# Patient Record
Sex: Female | Born: 1990 | Race: White | Hispanic: No | Marital: Married | Smoking: Current every day smoker
Health system: Southern US, Community
[De-identification: ages and names within clinical notes are randomized; demographics above are authoritative.]

## PROBLEM LIST (undated history)

## (undated) DIAGNOSIS — E611 Iron deficiency: Secondary | ICD-10-CM

## (undated) HISTORY — PX: WISDOM TOOTH EXTRACTION: SHX21

---
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
## 2011-01-28 ENCOUNTER — Emergency Department: Payer: Self-pay | Admitting: Unknown Physician Specialty

## 2011-03-26 ENCOUNTER — Ambulatory Visit: Payer: Self-pay | Admitting: Family Medicine

## 2011-06-15 ENCOUNTER — Observation Stay: Payer: Self-pay

## 2011-06-20 ENCOUNTER — Ambulatory Visit: Payer: Self-pay | Admitting: Advanced Practice Midwife

## 2011-06-23 ENCOUNTER — Inpatient Hospital Stay: Payer: Self-pay | Admitting: Obstetrics and Gynecology

## 2012-09-07 ENCOUNTER — Emergency Department: Payer: Self-pay | Admitting: Emergency Medicine

## 2012-09-07 LAB — COMPREHENSIVE METABOLIC PANEL
Anion Gap: 7 (ref 7–16)
BUN: 7 mg/dL (ref 7–18)
Bilirubin,Total: 0.4 mg/dL (ref 0.2–1.0)
Chloride: 106 mmol/L (ref 98–107)
Co2: 25 mmol/L (ref 21–32)
Creatinine: 0.69 mg/dL (ref 0.60–1.30)
EGFR (African American): >60
Osmolality: 273 (ref 275–301)
Potassium: 4.6 mmol/L (ref 3.5–5.1)
SGOT(AST): 37 U/L (ref 15–37)
SGPT (ALT): 12 U/L (ref 12–78)
Total Protein: 8.2 g/dL (ref 6.4–8.2)

## 2012-09-07 LAB — CBC
HCT: 33.2 % — ABNORMAL LOW (ref 35.0–47.0)
HGB: 10.3 g/dL — ABNORMAL LOW (ref 12.0–16.0)
MCH: 27.3 pg (ref 26.0–34.0)
MCHC: 31.2 g/dL — ABNORMAL LOW (ref 32.0–36.0)
Platelet: 348 10*3/uL (ref 150–440)
RBC: 3.78 10*6/uL — ABNORMAL LOW (ref 3.80–5.20)
RDW: 16.9 % — ABNORMAL HIGH (ref 11.5–14.5)
WBC: 12.7 10*3/uL — ABNORMAL HIGH (ref 3.6–11.0)

## 2012-09-07 LAB — URINALYSIS, COMPLETE
Nitrite: NEGATIVE
Protein: NEGATIVE
RBC,UR: 10 /HPF (ref 0–5)
WBC UR: 278 /HPF (ref 0–5)

## 2013-03-10 ENCOUNTER — Emergency Department: Payer: Self-pay | Admitting: Emergency Medicine

## 2013-08-05 ENCOUNTER — Emergency Department: Payer: Self-pay | Admitting: Emergency Medicine

## 2017-07-16 ENCOUNTER — Encounter (HOSPITAL_BASED_OUTPATIENT_CLINIC_OR_DEPARTMENT_OTHER): Payer: Self-pay | Admitting: *Deleted

## 2017-07-16 ENCOUNTER — Emergency Department (HOSPITAL_BASED_OUTPATIENT_CLINIC_OR_DEPARTMENT_OTHER): Payer: Self-pay

## 2017-07-16 ENCOUNTER — Emergency Department (HOSPITAL_BASED_OUTPATIENT_CLINIC_OR_DEPARTMENT_OTHER)
Admission: EM | Admit: 2017-07-16 | Discharge: 2017-07-16 | Disposition: A | Discharge status: Home / Self Care | Payer: Self-pay | Attending: Emergency Medicine | Admitting: Emergency Medicine

## 2017-07-16 DIAGNOSIS — F17200 Nicotine dependence, unspecified, uncomplicated: Secondary | ICD-10-CM | POA: Insufficient documentation

## 2017-07-16 DIAGNOSIS — R42 Dizziness and giddiness: Secondary | ICD-10-CM | POA: Insufficient documentation

## 2017-07-16 DIAGNOSIS — Z79899 Other long term (current) drug therapy: Secondary | ICD-10-CM | POA: Insufficient documentation

## 2017-07-16 HISTORY — DX: Iron deficiency: E61.1

## 2017-07-16 LAB — URINALYSIS, MICROSCOPIC (REFLEX): WBC UA: NONE SEEN WBC/hpf (ref 0–5)

## 2017-07-16 LAB — D-DIMER, QUANTITATIVE: D-Dimer, Quant: <0.27 ug/mL-FEU (ref 0.00–0.50)

## 2017-07-16 LAB — CBC WITH DIFFERENTIAL/PLATELET
BASOS ABS: 0 10*3/uL (ref 0.0–0.1)
Basophils Relative: 0 %
Eosinophils Absolute: 0.1 10*3/uL (ref 0.0–0.7)
Eosinophils Relative: 1 %
HEMATOCRIT: 40.2 % (ref 36.0–46.0)
HEMOGLOBIN: 13.8 g/dL (ref 12.0–15.0)
LYMPHS ABS: 1.8 10*3/uL (ref 0.7–4.0)
Lymphocytes Relative: 28 %
MCH: 33.7 pg (ref 26.0–34.0)
MCHC: 34.3 g/dL (ref 30.0–36.0)
MCV: 98 fL (ref 78.0–100.0)
Monocytes Absolute: 0.5 10*3/uL (ref 0.1–1.0)
Monocytes Relative: 8 %
NEUTROS ABS: 4.2 10*3/uL (ref 1.7–7.7)
NEUTROS PCT: 63 %
PLATELETS: 162 10*3/uL (ref 150–400)
RBC: 4.1 MIL/uL (ref 3.87–5.11)
RDW: 12.1 % (ref 11.5–15.5)
WBC: 6.6 10*3/uL (ref 4.0–10.5)

## 2017-07-16 LAB — COMPREHENSIVE METABOLIC PANEL
ALT: 12 U/L — ABNORMAL LOW (ref 14–54)
ANION GAP: 6 (ref 5–15)
AST: 20 U/L (ref 15–41)
Albumin: 4.1 g/dL (ref 3.5–5.0)
Alkaline Phosphatase: 47 U/L (ref 38–126)
BILIRUBIN TOTAL: 0.8 mg/dL (ref 0.3–1.2)
BUN: 8 mg/dL (ref 6–20)
CHLORIDE: 108 mmol/L (ref 101–111)
CO2: 25 mmol/L (ref 22–32)
Calcium: 9.2 mg/dL (ref 8.9–10.3)
Creatinine, Ser: 0.76 mg/dL (ref 0.44–1.00)
GFR calc Af Amer: >60 mL/min (ref >60)
Glucose, Bld: 86 mg/dL (ref 65–99)
POTASSIUM: 3.8 mmol/L (ref 3.5–5.1)
Sodium: 139 mmol/L (ref 135–145)
TOTAL PROTEIN: 6.9 g/dL (ref 6.5–8.1)

## 2017-07-16 LAB — URINALYSIS, ROUTINE W REFLEX MICROSCOPIC
Bilirubin Urine: NEGATIVE
Glucose, UA: NEGATIVE mg/dL
KETONES UR: NEGATIVE mg/dL
LEUKOCYTES UA: NEGATIVE
Nitrite: NEGATIVE
PROTEIN: NEGATIVE mg/dL
Specific Gravity, Urine: <1.005 — ABNORMAL LOW (ref 1.005–1.030)
pH: 6 (ref 5.0–8.0)

## 2017-07-16 LAB — ETHANOL

## 2017-07-16 LAB — TROPONIN I: Troponin I: <0.03 ng/mL (ref <0.03)

## 2017-07-16 LAB — TSH: TSH: 1.537 u[IU]/mL (ref 0.350–4.500)

## 2017-07-16 LAB — PREGNANCY, URINE: Preg Test, Ur: NEGATIVE

## 2017-07-16 MED ORDER — MECLIZINE HCL 25 MG PO TABS
12.5000 mg | ORAL_TABLET | Freq: Once | ORAL | Status: DC
Start: 2017-07-16 — End: 2017-07-16
  Filled 2017-07-16: qty 1

## 2017-07-16 MED ORDER — MECLIZINE HCL 25 MG PO TABS
12.5000 mg | ORAL_TABLET | Freq: Once | ORAL | Status: AC
  Administered 2017-07-16: 12.5 mg via ORAL
  Filled 2017-07-16: qty 1

## 2017-07-16 MED ORDER — MECLIZINE HCL 25 MG PO TABS: ORAL_TABLET | ORAL | 0 refills | Status: DC

## 2017-07-16 MED ORDER — SODIUM CHLORIDE 0.9 % IV BOLUS (SEPSIS)
1000.0000 mL | Freq: Once | INTRAVENOUS | Status: AC
  Administered 2017-07-16: 1000 mL via INTRAVENOUS

## 2017-07-16 NOTE — ED Notes (Addendum)
Approx 1 min after IV placement, pt became unresponsive. Initially unresponsive to sternal rub. Episode resolved within approx 20 secs. Pt woke up with mild confusion and was tearful. Pt fully back to baseline within 5 mins. Pause noted to cardiac rhythm during episode; pt reports hx of passing out with lab draws -- PA-C aware.

## 2017-07-16 NOTE — Discharge Instructions (Addendum)
There were no acute or significant abnormalities on lab work or imaging. Be sure to stay well-hydrated. Get plenty of rest. Reduce stress. May use the meclizine, as needed, for symptoms of dizziness. You may also try performing the Epley maneuver. Follow up with a primary care provider on this matter. Return to the ED should symptoms worsen.

## 2017-07-16 NOTE — ED Notes (Signed)
Patient transported to CT on bedside cardiac monitor -- accompanied by this RN.

## 2017-07-16 NOTE — ED Notes (Signed)
ED Provider at bedside. 

## 2017-07-16 NOTE — ED Triage Notes (Signed)
Pt reports feeling light-headed and nauseated since last night. Denies fever, diarrhea, genitourinary symptoms.

## 2017-07-16 NOTE — ED Provider Notes (Signed)
[facility]   CSN: [id number] Arrival date & time: 07/16/17  5621     History   Chief Complaint Chief Complaint  Patient presents with  . Dizziness    HPI Melanie [name] is a 26 y.o. female.  HPI   Melanie [name] is a 26 y.o. female, with a history of iron deficiency, presenting to the ED with lightheadedness beginning last night. Occurred while eating, worsened with standing. Accompanied by nausea. States she thought maybe she was dehydrated so she "drank a lot of water." Endorses some urinary urgency beginning yesterday. Currently has some minor lightheadedness/dizziness. States the sensation is worse when turning her head to the left or looking upward. Denies alcohol or illicit drug use. No medication changes. LMP 06/12/17. States she has been eating and drinking regularly. States she has regular weight fluctuations of greater than 10lbs at a time, but does not know why.  Denies fever/chills, vomiting/diarrhea, visual deficits, SOB, CP, abdominal pain, numbness/weakness, head injury, headache, falls, or any other complaints.     Past Medical History:  Diagnosis Date  . Iron deficiency     There are no active problems to display for this patient.   Past Surgical History:  Procedure Laterality Date  . WISDOM TOOTH EXTRACTION      OB History    No data available       Home Medications    Prior to Admission medications   Medication Sig Start Date End Date Taking? Authorizing Provider  Iron Combinations (IRON COMPLEX PO) Take by mouth.   Yes [provider]  Multiple Vitamin (MULTIVITAMIN) tablet Take 1 tablet by mouth daily.   Yes [provider]  meclizine (ANTIVERT) 25 MG tablet Take 0.5 tablet (12.5 mg) to 1 tablet (25 mg) up to 3 times a day as needed for dizziness. 07/16/17   Anselm Pancoast, PA-C    Family History No family history on file.  Social History Social History  Substance Use Topics  . Smoking status:  Current Every Day Smoker  . Smokeless tobacco: Never Used  . Alcohol use No     Allergies   Patient has no known allergies.   Review of Systems Review of Systems  Constitutional: Negative for chills, diaphoresis and fever.  HENT: Negative for trouble swallowing.   Eyes: Negative for visual disturbance.  Respiratory: Negative for shortness of breath.   Cardiovascular: Negative for chest pain.  Gastrointestinal: Positive for nausea. Negative for abdominal pain, blood in stool, diarrhea and vomiting.  Genitourinary: Positive for urgency.  Musculoskeletal: Negative for back pain and neck pain.  Neurological: Positive for dizziness and light-headedness. Negative for seizures, syncope, facial asymmetry, speech difficulty, weakness, numbness and headaches.  Psychiatric/Behavioral: Negative for confusion.  All other systems reviewed and are negative.    Physical Exam Updated Vital Signs BP 112/66 (BP Location: Right Arm)   Pulse 70   Temp 98.2 F (36.8 C) (Oral)   Resp 18   Ht 5\' 4"  (1.626 m)   Wt 39.5 kg (87 lb)   LMP 06/11/2017 (Approximate)   SpO2 100%   BMI 14.93 kg/m   Physical Exam  Constitutional: She is oriented to person, place, and time. She appears well-developed and well-nourished. No distress.  HENT:  Head: Normocephalic and atraumatic.  Eyes: Pupils are equal, round, and reactive to light. Conjunctivae and EOM are normal.  Neck: Normal range of motion. Neck supple.  Cardiovascular: Normal rate, regular rhythm, normal heart sounds and intact distal  pulses.   Pulmonary/Chest: Effort normal and breath sounds normal. No respiratory distress.  Abdominal: Soft. There is no tenderness. There is no guarding.  Musculoskeletal: She exhibits no edema.  Normal motor function intact in all extremities and spine. No midline spinal tenderness.   Lymphadenopathy:    She has no cervical adenopathy.  Neurological: She is alert and oriented to person, place, and time.  No  sensory deficits. Strength 5/5 in all extremities. No gait disturbance. Coordination intact including heel to shin and finger to nose. Cranial nerves III-XII grossly intact. No facial droop.  Dizziness worse with lying down and turning head to left. No vertical nystagmus. Cover-uncover test normal.   Skin: Skin is warm and dry. Capillary refill takes less than 2 seconds. She is not diaphoretic.  Psychiatric: She has a normal mood and affect. Her behavior is normal.  Nursing note and vitals reviewed.    ED Treatments / Results  Labs (all labs ordered are listed, but only abnormal results are displayed) Labs Reviewed  URINALYSIS, ROUTINE W REFLEX MICROSCOPIC - Abnormal; Notable for the following:       Result Value   Specific Gravity, Urine <1.005 (*)    Hgb urine dipstick TRACE (*)    All other components within normal limits  COMPREHENSIVE METABOLIC PANEL - Abnormal; Notable for the following:    ALT 12 (*)    All other components within normal limits  URINALYSIS, MICROSCOPIC (REFLEX) - Abnormal; Notable for the following:    Bacteria, UA RARE (*)    Squamous Epithelial / LPF 0-5 (*)    All other components within normal limits  PREGNANCY, URINE  ETHANOL  CBC WITH DIFFERENTIAL/PLATELET  TROPONIN I  TSH  D-DIMER, QUANTITATIVE (NOT AT [facility])    EKG  EKG Interpretation  Date/Time:  Wednesday July 16 2017 11:07:05 EDT Ventricular Rate:  72 PR Interval:    QRS Duration: 83 QT Interval:  390 QTC Calculation: 427 R Axis:   74 Text Interpretation:  Sinus arrhythmia No previous ECGs available Confirmed by Frederick Peers [phone]) on 07/16/2017 11:45:25 AM       Radiology Dg Chest 2 View  Result Date: 07/16/2017 CLINICAL DATA:  Lightheadedness.  Nausea. EXAM: CHEST  2 VIEW COMPARISON:  No recent prior . FINDINGS: Mediastinum hilar structures normal. Lungs are clear. Nipple shadow noted . No focal infiltrate. No pleural effusion or pneumothorax. No acute bony abnormality  identified. Thoracic spine scoliosis . IMPRESSION: No acute cardiopulmonary disease. Electronically Signed   By: Maisie Fus  Register   On: 07/16/2017 12:27   Ct Head Wo Contrast  Result Date: 07/16/2017 CLINICAL DATA:  Pt reports feeling light-headed, nauseated, and just not herself since last night, states that she passed out while nurse was starting her IV, patient appears very shaky, hx of anemia, no other complaints EXAM: CT HEAD WITHOUT CONTRAST TECHNIQUE: Contiguous axial images were obtained from the base of the skull through the vertex without intravenous contrast. COMPARISON:  None. FINDINGS: Brain: No evidence of acute infarction, hemorrhage, hydrocephalus, extra-axial collection or mass lesion/mass effect. Vascular: No hyperdense vessel or unexpected calcification. Skull: Normal. Negative for fracture or focal lesion. Sinuses/Orbits: Visualize globes orbits are unremarkable. Visualized sinuses and mastoid air cells are clear. Other: None. IMPRESSION: Normal unenhanced CT scan of the brain. Electronically Signed   By: Amie Portland M.D.   On: 07/16/2017 12:27    Procedures Procedures (including critical care time)  Medications Ordered in ED Medications  sodium chloride 0.9 % bolus 1,000 mL (  0 mLs Intravenous Stopped 07/16/17 1200)  meclizine (ANTIVERT) tablet 12.5 mg (12.5 mg Oral Given 07/16/17 1335)     Initial Impression / Assessment and Plan / ED Course  I have reviewed the triage vital signs and the nursing notes.  Pertinent labs & imaging results that were available during my care of the patient were reviewed by me and considered in my medical decision making (see chart for details).     Patient presents with complaint of dizziness. Patient is nontoxic appearing, afebrile, not tachycardic, not tachypneic, not hypotensive, maintains SPO2 of 100% on room air, and is in no apparent distress.  Symptoms appear to be consistent with peripheral vertigo. Dizziness reproducible. Lab  results reassuring. Patient improved with meclizine. Ambulatory around the department without difficulty or assistance. PCP follow-up. Resources given. The patient was given instructions for home care as well as return precautions. Patient voices understanding of these instructions, accepts the plan, and is comfortable with discharge.   RN reports patient had a complete syncopal episode while lying supine right after IV start. RN reports complete unresponsiveness for a period of about 20 seconds. Suspect vasovagal reaction. EKG findings: I think arrhythmia is unlikely. EKG shows minor sinus arrhythmia with no interval abnormalities such as QT prolongation or WPW. There are no findings to suggest Brugada syndrome. Cardiac monitoring in the emergency department reveals not tachycardic or bradycardic dysrhythmia. Hypertrophic cardiomyopathy was considered, but there are no clear historical elements pointing toward this. EKG is not suggestive. The QRS voltages is not extremely large and there are no suggestive Q waves.  Patient does not have any of the following high risk syncope factors: Older age Palpitations prior to syncope Exertional syncope Family history of sudden cardiac death Evidence of bleeding Persistently abnormal vital signs Heart murmur Abnormal EKG   Vitals:   07/16/17 1219 07/16/17 1230 07/16/17 1300 07/16/17 1336  BP: 109/68 107/65 104/62 113/80  Pulse: 85 87 95 77  Resp: 15 14 15 16   Temp:      TempSrc:      SpO2: 100% 100% 100% 100%  Weight:      Height:         Orthostatic VS for the past 24 hrs:  BP- Lying Pulse- Lying BP- Sitting Pulse- Sitting BP- Standing at 0 minutes Pulse- Standing at 0 minutes  07/16/17 1111 104/61 66 110/67 69 115/68 89      Final Clinical Impressions(s) / ED Diagnoses   Final diagnoses:  Dizziness    New Prescriptions Discharge Medication List as of 07/16/2017  1:45 PM    START taking these medications   Details  meclizine  (ANTIVERT) 25 MG tablet Take 0.5 tablet (12.5 mg) to 1 tablet (25 mg) up to 3 times a day as needed for dizziness., Print         Anselm PancoastJoy, Sheritha Louis C, PA-C 07/16/17 1754    Little, Ambrose Finlandachel Morgan, MD 07/17/17 [phone]

## 2017-11-07 ENCOUNTER — Other Ambulatory Visit: Payer: Self-pay

## 2017-11-07 ENCOUNTER — Encounter (HOSPITAL_BASED_OUTPATIENT_CLINIC_OR_DEPARTMENT_OTHER): Payer: Self-pay | Admitting: Emergency Medicine

## 2017-11-07 ENCOUNTER — Emergency Department (HOSPITAL_BASED_OUTPATIENT_CLINIC_OR_DEPARTMENT_OTHER)
Admission: EM | Admit: 2017-11-07 | Discharge: 2017-11-07 | Disposition: A | Discharge status: Home / Self Care | Payer: Self-pay | Attending: Emergency Medicine | Admitting: Emergency Medicine

## 2017-11-07 DIAGNOSIS — J029 Acute pharyngitis, unspecified: Secondary | ICD-10-CM | POA: Insufficient documentation

## 2017-11-07 DIAGNOSIS — Z79899 Other long term (current) drug therapy: Secondary | ICD-10-CM | POA: Insufficient documentation

## 2017-11-07 DIAGNOSIS — F17210 Nicotine dependence, cigarettes, uncomplicated: Secondary | ICD-10-CM | POA: Insufficient documentation

## 2017-11-07 LAB — RAPID STREP SCREEN (MED CTR MEBANE ONLY): Streptococcus, Group A Screen (Direct): NEGATIVE

## 2017-11-07 NOTE — ED Triage Notes (Signed)
Patient reports that she has "white spots" on the back of her throat - the patient states that she has had a sore throat x 2 weeks

## 2017-11-07 NOTE — ED Provider Notes (Signed)
[facility] EMERGENCY DEPARTMENT Provider Note   CSN: [id number] Arrival date & time: 11/07/17  1346     History   Chief Complaint Chief Complaint  Patient presents with  . Sore Throat    HPI Melanie [name] is a 26 y.o. female.  The history is provided by the patient. No language interpreter was used.  Sore Throat    Melanie [name] is a 26 y.o. female who presents to the Emergency Department complaining of sore throat.  She reports 1-2 weeks of sore throat that is mild in nature.  She does have associated runny nose, occasional sneezing and occasional cough.  She thought she had a regular cold.  She looked in her throat and noticed there were white spots in the back and became concerned and came in for evaluation.  No reports of fever, vomiting, diarrhea.  She has been using salt water mixed with baking soda and apple cider vinegar gargles with partial improvement in her symptoms.  She has no medical problems and takes no medications. Past Medical History:  Diagnosis Date  . Iron deficiency     There are no active problems to display for this patient.   Past Surgical History:  Procedure Laterality Date  . WISDOM TOOTH EXTRACTION      OB History    No data available       Home Medications    Prior to Admission medications   Medication Sig Start Date End Date Taking? Authorizing Provider  Iron Combinations (IRON COMPLEX PO) Take by mouth.    [provider]  meclizine (ANTIVERT) 25 MG tablet Take 0.5 tablet (12.5 mg) to 1 tablet (25 mg) up to 3 times a day as needed for dizziness. 07/16/17   Joy, Shawn C, PA-C  Multiple Vitamin (MULTIVITAMIN) tablet Take 1 tablet by mouth daily.    [provider]    Family History History reviewed. No pertinent family history.  Social History Social History   Tobacco Use  . Smoking status: Current Every Day Smoker  . Smokeless tobacco: Never Used  Substance Use Topics  . Alcohol use: No  .  Drug use: No     Allergies   Patient has no known allergies.   Review of Systems Review of Systems  All other systems reviewed and are negative.    Physical Exam Updated Vital Signs BP 115/76 (BP Location: Left Arm)   Pulse 74   Temp (!) 97.5 F (36.4 C) (Oral)   Resp 18   Ht 5\' 4"  (1.626 m)   Wt 39.9 kg (88 lb)   LMP 10/17/2017 (Approximate)   SpO2 100%   BMI 15.11 kg/m   Physical Exam  Constitutional: She is oriented to person, place, and time. She appears well-developed and well-nourished.  HENT:  Head: Normocephalic and atraumatic.  Right Ear: Tympanic membrane normal.  Left Ear: Tympanic membrane normal.  Mouth/Throat: Uvula is midline and oropharynx is clear and moist.  Minimal erythema in the posterior oropharynx.  There is one small exudate on the left tonsil.  No cervical lymphadenopathy.  No peritonsillar abscess.  Cardiovascular: Normal rate and regular rhythm.  No murmur heard. Pulmonary/Chest: Effort normal and breath sounds normal. No respiratory distress.  Abdominal: Soft. There is no tenderness. There is no rebound and no guarding.  Musculoskeletal: She exhibits no edema or tenderness.  Neurological: She is alert and oriented to person, place, and time.  Skin: Skin is warm and dry.  Psychiatric: She has a  normal mood and affect. Her behavior is normal.  Nursing note and vitals reviewed.    ED Treatments / Results  Labs (all labs ordered are listed, but only abnormal results are displayed) Labs Reviewed  RAPID STREP SCREEN (NOT AT [facility])  CULTURE, GROUP A STREP [facility]    EKG  EKG Interpretation None       Radiology No results found.  Procedures Procedures (including critical care time)  Medications Ordered in ED Medications - No data to display   Initial Impression / Assessment and Plan / ED Course  I have reviewed the triage vital signs and the nursing notes.  Pertinent labs & imaging results that were available during my care  of the patient were reviewed by me and considered in my medical decision making (see chart for details).     Patient here for evaluation of sore throat and pharyngeal exudates.  She is nontoxic appearing on examination with small amount of exudate.  No evidence of PTA, RPA.  Counseled patient on home care for viral pharyngitis.  Discussed outpatient follow-up and return precautions.  Final Clinical Impressions(s) / ED Diagnoses   Final diagnoses:  Viral pharyngitis    ED Discharge Orders    None       Tilden Fossaees, Laretha Luepke, MD 11/07/17 1507

## 2017-11-10 LAB — CULTURE, GROUP A STREP (THRC)

## 2018-02-18 ENCOUNTER — Other Ambulatory Visit: Payer: Self-pay

## 2018-02-18 ENCOUNTER — Emergency Department
Admission: EM | Admit: 2018-02-18 | Discharge: 2018-02-18 | Disposition: A | Discharge status: Home / Self Care | Payer: Self-pay | Attending: Emergency Medicine | Admitting: Emergency Medicine

## 2018-02-18 ENCOUNTER — Encounter: Payer: Self-pay | Admitting: Emergency Medicine

## 2018-02-18 DIAGNOSIS — F410 Panic disorder [episodic paroxysmal anxiety] without agoraphobia: Secondary | ICD-10-CM | POA: Insufficient documentation

## 2018-02-18 DIAGNOSIS — Z79899 Other long term (current) drug therapy: Secondary | ICD-10-CM | POA: Insufficient documentation

## 2018-02-18 DIAGNOSIS — F17200 Nicotine dependence, unspecified, uncomplicated: Secondary | ICD-10-CM | POA: Insufficient documentation

## 2018-02-18 LAB — URINALYSIS, COMPLETE (UACMP) WITH MICROSCOPIC
Bilirubin Urine: NEGATIVE
Glucose, UA: NEGATIVE mg/dL
Ketones, ur: NEGATIVE mg/dL
Leukocytes, UA: NEGATIVE
Nitrite: NEGATIVE
Protein, ur: NEGATIVE mg/dL
RBC / HPF: NONE SEEN RBC/hpf (ref 0–5)
SPECIFIC GRAVITY, URINE: 1.005 (ref 1.005–1.030)
pH: 7 (ref 5.0–8.0)

## 2018-02-18 LAB — TSH: TSH: 1.439 u[IU]/mL (ref 0.350–4.500)

## 2018-02-18 LAB — COMPREHENSIVE METABOLIC PANEL
ALBUMIN: 3.7 g/dL (ref 3.5–5.0)
ALT: 22 U/L (ref 14–54)
ANION GAP: 5 (ref 5–15)
AST: 28 U/L (ref 15–41)
Alkaline Phosphatase: 58 U/L (ref 38–126)
CALCIUM: 8.5 mg/dL — AB (ref 8.9–10.3)
CO2: 26 mmol/L (ref 22–32)
CREATININE: 0.59 mg/dL (ref 0.44–1.00)
Chloride: 111 mmol/L (ref 101–111)
GFR calc Af Amer: >60 mL/min (ref >60)
GFR calc non Af Amer: >60 mL/min (ref >60)
GLUCOSE: 105 mg/dL — AB (ref 65–99)
Potassium: 3.7 mmol/L (ref 3.5–5.1)
SODIUM: 142 mmol/L (ref 135–145)
Total Bilirubin: 0.7 mg/dL (ref 0.3–1.2)
Total Protein: 6.7 g/dL (ref 6.5–8.1)

## 2018-02-18 LAB — URINE DRUG SCREEN, QUALITATIVE (ARMC ONLY)
Amphetamines, Ur Screen: NOT DETECTED
BARBITURATES, UR SCREEN: NOT DETECTED
BENZODIAZEPINE, UR SCRN: NOT DETECTED
CANNABINOID 50 NG, UR ~~LOC~~: NOT DETECTED
COCAINE METABOLITE, UR ~~LOC~~: NOT DETECTED
MDMA (Ecstasy)Ur Screen: NOT DETECTED
Methadone Scn, Ur: NOT DETECTED
OPIATE, UR SCREEN: NOT DETECTED
Phencyclidine (PCP) Ur S: NOT DETECTED
TRICYCLIC, UR SCREEN: NOT DETECTED

## 2018-02-18 LAB — CBC WITH DIFFERENTIAL/PLATELET
Basophils Absolute: 0.1 10*3/uL (ref 0–0.1)
Basophils Relative: 1 %
Eosinophils Absolute: 0 10*3/uL (ref 0–0.7)
Eosinophils Relative: 0 %
HCT: 42.2 % (ref 35.0–47.0)
Hemoglobin: 13.6 g/dL (ref 12.0–16.0)
Lymphocytes Relative: 12 %
Lymphs Abs: 1.1 10*3/uL (ref 1.0–3.6)
MCH: 33.1 pg (ref 26.0–34.0)
MCHC: 32.3 g/dL (ref 32.0–36.0)
MCV: 102.6 fL — ABNORMAL HIGH (ref 80.0–100.0)
MONO ABS: 0.5 10*3/uL (ref 0.2–0.9)
MONOS PCT: 5 %
NEUTROS ABS: 8.1 10*3/uL — AB (ref 1.4–6.5)
Neutrophils Relative %: 82 %
Platelets: 171 10*3/uL (ref 150–440)
RBC: 4.11 MIL/uL (ref 3.80–5.20)
RDW: 13.6 % (ref 11.5–14.5)
WBC: 9.8 10*3/uL (ref 3.6–11.0)

## 2018-02-18 LAB — T4, FREE: FREE T4: 1 ng/dL (ref 0.61–1.12)

## 2018-02-18 LAB — HCG, QUANTITATIVE, PREGNANCY: hCG, Beta Chain, Quant, S: <1 m[IU]/mL (ref <5)

## 2018-02-18 MED ORDER — LORAZEPAM 1 MG PO TABS
1.0000 mg | ORAL_TABLET | Freq: Once | ORAL | Status: AC
  Administered 2018-02-18: 1 mg via ORAL
  Filled 2018-02-18: qty 1

## 2018-02-18 MED ORDER — SODIUM CHLORIDE 0.9 % IV BOLUS
1000.0000 mL | Freq: Once | INTRAVENOUS | Status: AC
  Administered 2018-02-18: 1000 mL via INTRAVENOUS

## 2018-02-18 MED ORDER — LORAZEPAM 1 MG PO TABS: 1.0000 mg | ORAL_TABLET | Freq: Two times a day (BID) | ORAL | 0 refills | Status: AC | PRN

## 2018-02-18 NOTE — ED Triage Notes (Addendum)
Arrives via [facility].  Patient states she felt like she was going to pass out, feeling shaky.  Patient has history of anxiety and states "I think that is what it is. I don't know".  Onset of symptoms 0900.  Patient AAOx3. Skin warm and dry. NAD  Emotionally labile.  Patient suddenly became tearful in triage stating "I just don't know what is wrong with me"

## 2018-02-18 NOTE — ED Notes (Signed)
Pt is extremely anxious at this time about doing blood work, pt hyperventilating and sobbing, HR 150s. RN held non rebreather to pt and did breathing exercises, pt became calm, HR 110s. MD made aware

## 2018-02-18 NOTE — ED Notes (Addendum)
Will wait for PO ativan to take affect before drawing lab work per MD , pt aware of plan of care and agrees.

## 2018-02-18 NOTE — Discharge Instructions (Signed)
Please make an appointment to follow-up with a therapist for reevaluation and use your Ativan only as needed for severe symptoms.  Return to the emergency department for any concerns.  It was a pleasure to take care of you today, and thank you for coming to our emergency department.  If you have any questions or concerns before leaving please ask the nurse to grab me and I'm more than happy to go through your aftercare instructions again.  If you were prescribed any opioid pain medication today such as Norco, Vicodin, Percocet, morphine, hydrocodone, or oxycodone please make sure you do not drive when you are taking this medication as it can alter your ability to drive safely.  If you have any concerns once you are home that you are not improving or are in fact getting worse before you can make it to your follow-up appointment, please do not hesitate to call [phone] and come back for further evaluation.  Merrily Brittle, MD  Results for orders placed or performed during the hospital encounter of 02/18/18  Comprehensive metabolic panel  Result Value Ref Range   Sodium 142 135 - 145 mmol/L   Potassium 3.7 3.5 - 5.1 mmol/L   Chloride 111 101 - 111 mmol/L   CO2 26 22 - 32 mmol/L   Glucose, Bld 105 (H) 65 - 99 mg/dL   BUN <5 (L) 6 - 20 mg/dL   Creatinine, Ser 1.61 0.44 - 1.00 mg/dL   Calcium 8.5 (L) 8.9 - 10.3 mg/dL   Total Protein 6.7 6.5 - 8.1 g/dL   Albumin 3.7 3.5 - 5.0 g/dL   AST 28 15 - 41 U/L   ALT 22 14 - 54 U/L   Alkaline Phosphatase 58 38 - 126 U/L   Total Bilirubin 0.7 0.3 - 1.2 mg/dL   GFR calc non Af Amer >60 >60 mL/min   GFR calc Af Amer >60 >60 mL/min   Anion gap 5 5 - 15  CBC with Differential  Result Value Ref Range   WBC 9.8 3.6 - 11.0 K/uL   RBC 4.11 3.80 - 5.20 MIL/uL   Hemoglobin 13.6 12.0 - 16.0 g/dL   HCT 09.6 04.5 - 40.9 %   MCV 102.6 (H) 80.0 - 100.0 fL   MCH 33.1 26.0 - 34.0 pg   MCHC 32.3 32.0 - 36.0 g/dL   RDW 81.1 91.4 - 78.2 %   Platelets 171 150 - 440 K/uL    Neutrophils Relative % 82 %   Neutro Abs 8.1 (H) 1.4 - 6.5 K/uL   Lymphocytes Relative 12 %   Lymphs Abs 1.1 1.0 - 3.6 K/uL   Monocytes Relative 5 %   Monocytes Absolute 0.5 0.2 - 0.9 K/uL   Eosinophils Relative 0 %   Eosinophils Absolute 0.0 0 - 0.7 K/uL   Basophils Relative 1 %   Basophils Absolute 0.1 0 - 0.1 K/uL  TSH  Result Value Ref Range   TSH 1.439 0.350 - 4.500 uIU/mL  T4, free  Result Value Ref Range   Free T4 1.00 0.61 - 1.12 ng/dL  hCG, quantitative, pregnancy  Result Value Ref Range   hCG, Beta Chain, Quant, S <1 <5 mIU/mL  Urine Drug Screen, Qualitative  Result Value Ref Range   Tricyclic, Ur Screen NONE DETECTED NONE DETECTED   Amphetamines, Ur Screen NONE DETECTED NONE DETECTED   MDMA (Ecstasy)Ur Screen NONE DETECTED NONE DETECTED   Cocaine Metabolite,Ur [place] NONE DETECTED NONE DETECTED   Opiate, Ur Screen NONE DETECTED  NONE DETECTED   Phencyclidine (PCP) Ur S NONE DETECTED NONE DETECTED   Cannabinoid 50 Ng, Ur [place] NONE DETECTED NONE DETECTED   Barbiturates, Ur Screen NONE DETECTED NONE DETECTED   Benzodiazepine, Ur Scrn NONE DETECTED NONE DETECTED   Methadone Scn, Ur NONE DETECTED NONE DETECTED  Urinalysis, Complete w Microscopic  Result Value Ref Range   Color, Urine YELLOW (A) YELLOW   APPearance HAZY (A) CLEAR   Specific Gravity, Urine 1.005 1.005 - 1.030   pH 7.0 5.0 - 8.0   Glucose, UA NEGATIVE NEGATIVE mg/dL   Hgb urine dipstick LARGE (A) NEGATIVE   Bilirubin Urine NEGATIVE NEGATIVE   Ketones, ur NEGATIVE NEGATIVE mg/dL   Protein, ur NEGATIVE NEGATIVE mg/dL   Nitrite NEGATIVE NEGATIVE   Leukocytes, UA NEGATIVE NEGATIVE   RBC / HPF NONE SEEN 0 - 5 RBC/hpf   WBC, UA 0-5 0 - 5 WBC/hpf   Bacteria, UA FEW (A) NONE SEEN   Squamous Epithelial / LPF 0-5 (A) NONE SEEN   Mucus PRESENT

## 2018-02-18 NOTE — ED Provider Notes (Signed)
[facility] Emergency Department Provider Note  ____________________________________________   First MD Initiated Contact with Patient 02/18/18 1049     (approximate)  I have reviewed the triage vital signs and the nursing notes.   HISTORY  Chief Complaint Anxiety   HPI Melanie [name] is a 27 y.o. female who comes to the emergency department via EMS with a near syncopal event.  She has a long-standing history of anxiety and today she began to hyperventilate feel short of breath and feel a "sense of doom" associated with lightheadedness dizziness and nearly passing out.  She has no family history of sudden cardiac death.  She has not actually passed out.  Her symptoms are intermittent and severe.  They seem to be worsened when thinking about life stressors and improved with deep breathing.  Past Medical History:  Diagnosis Date  . Iron deficiency     There are no active problems to display for this patient.   Past Surgical History:  Procedure Laterality Date  . WISDOM TOOTH EXTRACTION      Prior to Admission medications   Medication Sig Start Date End Date Taking? Authorizing Provider  Iron Combinations (IRON COMPLEX PO) Take by mouth.    [provider]  LORazepam (ATIVAN) 1 MG tablet Take 1 tablet (1 mg total) by mouth 2 (two) times daily as needed for anxiety. 02/18/18 02/18/19  Merrily Brittleifenbark, Mauriah Mcmillen, MD  meclizine (ANTIVERT) 25 MG tablet Take 0.5 tablet (12.5 mg) to 1 tablet (25 mg) up to 3 times a day as needed for dizziness. 07/16/17   Joy, Shawn C, PA-C  Multiple Vitamin (MULTIVITAMIN) tablet Take 1 tablet by mouth daily.    [provider]    Allergies Patient has no known allergies.  No family history on file.  Social History Social History   Tobacco Use  . Smoking status: Current Every Day Smoker  . Smokeless tobacco: Never Used  Substance Use Topics  . Alcohol use: No  . Drug use: No    Review of  Systems Constitutional: No fever/chills Eyes: No visual changes. ENT: No sore throat. Cardiovascular: Positive for chest pain. Respiratory: Positive for shortness of breath. Gastrointestinal: No abdominal pain.  No nausea, no vomiting.  No diarrhea.  No constipation. Genitourinary: Negative for dysuria. Musculoskeletal: Negative for back pain. Skin: Negative for rash. Neurological: Negative for headaches, focal weakness or numbness.   ____________________________________________   PHYSICAL EXAM:  VITAL SIGNS: ED Triage Vitals  Enc Vitals Group     BP 02/18/18 1039 130/74     Pulse Rate 02/18/18 1039 (!) 111     Resp 02/18/18 1039 16     Temp 02/18/18 1039 98 F (36.7 C)     Temp src --      SpO2 02/18/18 1039 99 %     Weight 02/18/18 1040 95 lb (43.1 kg)     Height 02/18/18 1040 5\' 4"  (1.626 m)     Head Circumference --      Peak Flow --      Pain Score 02/18/18 1039 0     Pain Loc --      Pain Edu? --      Excl. in GC? --     Constitutional: Alert and oriented x4 very anxious appearing nontoxic no diaphoresis speaks in full clear sentences Eyes: PERRL EOMI. mid range and brisk Head: Atraumatic. Nose: No congestion/rhinnorhea. Mouth/Throat: No trismus nonpalpable thyroid Neck: No stridor.   Cardiovascular: Tachycardic rate, regular rhythm. Grossly  normal heart sounds.  Good peripheral circulation. Respiratory: Normal respiratory effort.  No retractions. Lungs CTAB and moving good air Gastrointestinal: Soft nontender Musculoskeletal: No lower extremity edema   Neurologic:  Normal speech and language. No gross focal neurologic deficits are appreciated. Skin:  Skin is warm, dry and intact. No rash noted. Psychiatric: Extremely anxious appearing   ____________________________________________   DIFFERENTIAL includes but not limited to  Panic attack, thyrotoxicosis, dehydration, Brugada syndrome ____________________________________________   LABS (all labs  ordered are listed, but only abnormal results are displayed)  Labs Reviewed  COMPREHENSIVE METABOLIC PANEL - Abnormal; Notable for the following components:      Result Value   Glucose, Bld 105 (*)    BUN <5 (*)    Calcium 8.5 (*)    All other components within normal limits  CBC WITH DIFFERENTIAL/PLATELET - Abnormal; Notable for the following components:   MCV 102.6 (*)    Neutro Abs 8.1 (*)    All other components within normal limits  URINALYSIS, COMPLETE (UACMP) WITH MICROSCOPIC - Abnormal; Notable for the following components:   Color, Urine YELLOW (*)    APPearance HAZY (*)    Hgb urine dipstick LARGE (*)    Bacteria, UA FEW (*)    Squamous Epithelial / LPF 0-5 (*)    All other components within normal limits  TSH  T4, FREE  HCG, QUANTITATIVE, PREGNANCY  URINE DRUG SCREEN, QUALITATIVE ([facility] ONLY)    Lab work reviewed by me with elevated MCV of unclear etiology otherwise no acute disease __________________________________________  EKG  ED ECG REPORT I, Merrily Brittle, the attending physician, personally viewed and interpreted this ECG.  Date: 02/18/2018 EKG Time:  Rate: 100 Rhythm: normal sinus rhythm QRS Axis: normal Intervals: normal ST/T Wave abnormalities: normal Narrative Interpretation: no evidence of acute ischemia  ____________________________________________  RADIOLOGY   ____________________________________________   PROCEDURES  Procedure(s) performed: no  Procedures  Critical Care performed: no  Observation: no ____________________________________________   INITIAL IMPRESSION / ASSESSMENT AND PLAN / ED COURSE  Pertinent labs & imaging results that were available during my care of the patient were reviewed by me and considered in my medical decision making (see chart for details).  The patient arrives extremely anxious appearing tachycardic and nervous.  Her symptoms are most consistent with panic attack.  Ativan and general blood work  are pending.     ----------------------------------------- 12:38 PM on 02/18/2018 -----------------------------------------  After Ativan the patient feels significantly improved and consents to blood work. ____________________________________________   The patient was kept on monitor for multiple hours with no ectopy.  Her tachycardia is resolved and she feels nearly completely back to normal.  She likely has generalized anxiety disorder.  I will prescribe her 5 tablets of Ativan and refer her to [facility] as an outpatient.  Strict return precautions have been given and the patient verbalizes understanding and agreement with the plan.  No IVC criteria.  FINAL CLINICAL IMPRESSION(S) / ED DIAGNOSES  Final diagnoses:  Panic attack      NEW MEDICATIONS STARTED DURING THIS VISIT:  Discharge Medication List as of 02/18/2018  2:42 PM    START taking these medications   Details  LORazepam (ATIVAN) 1 MG tablet Take 1 tablet (1 mg total) by mouth 2 (two) times daily as needed for anxiety., Starting Wed 02/18/2018, Until Thu 02/18/2019, Print         Note:  This document was prepared using Dragon voice recognition software and may include unintentional dictation  errors.     Merrily Brittle, MD 02/20/18 [phone]

## 2019-03-16 IMAGING — CT CT HEAD W/O CM
3 series · 16 of 47 positions shown, 19 images · non-contrast
Comparison: None.

CLINICAL DATA: Pt reports feeling light-headed, nauseated, and just
not herself since last night, states that she passed out while nurse
was starting her IV, patient appears very shaky, hx of anemia, no
other complaints

EXAM:
CT HEAD WITHOUT CONTRAST
TECHNIQUE: Contiguous axial images were obtained from the base of the skull
through the vertex without intravenous contrast.

[Series 2: head wo · axial · 0.40mm/px · z∈[-165,-35]mm · 10 of 32 slices shown, 13 images]
[im 3/32  brain]
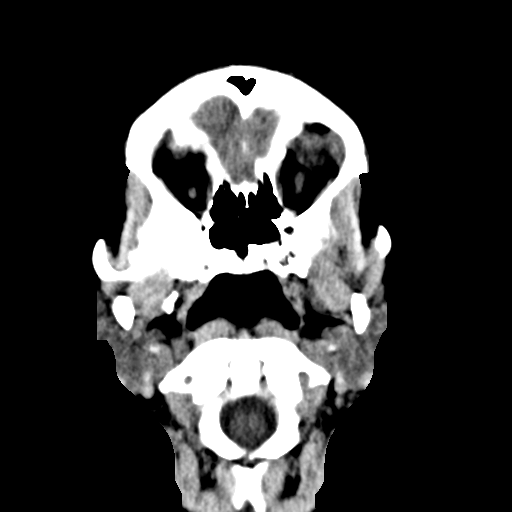
[im 3/32  bone]
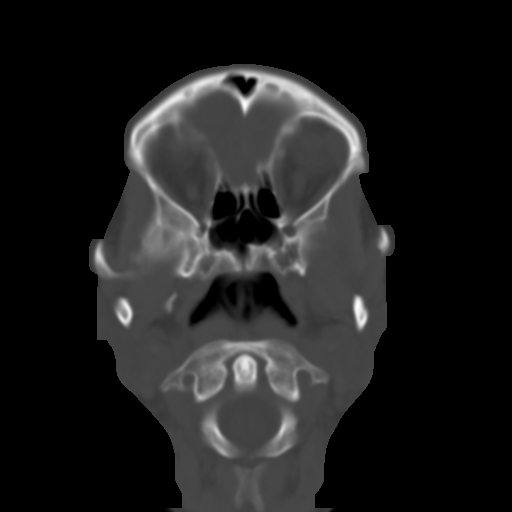
[im 6/32  brain]
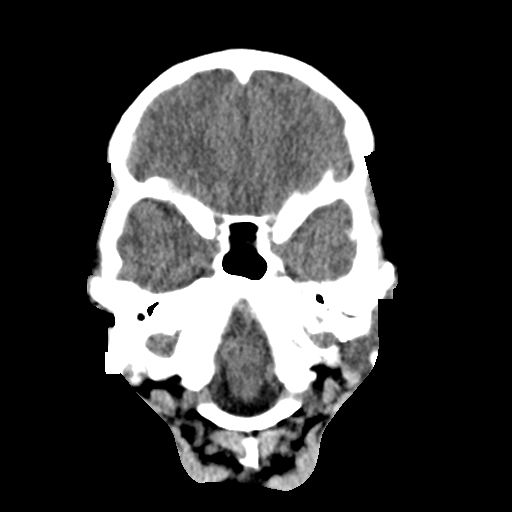
[im 9/32  brain]
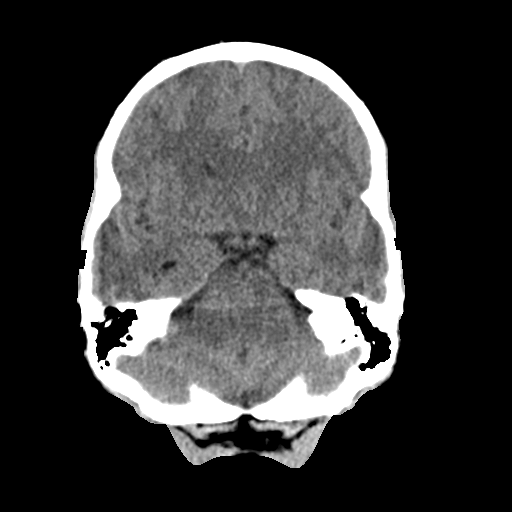
[im 11/32  brain]
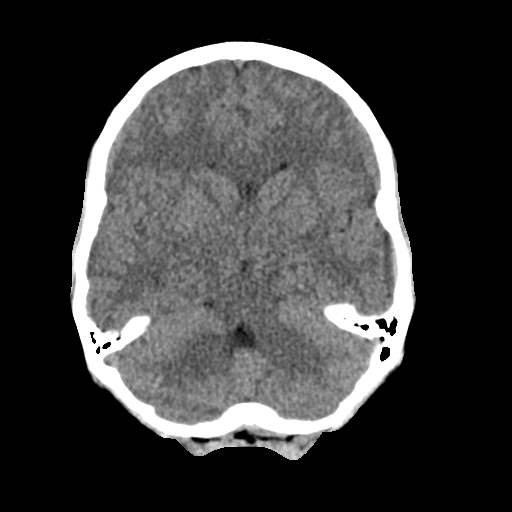
[im 14/32  brain]
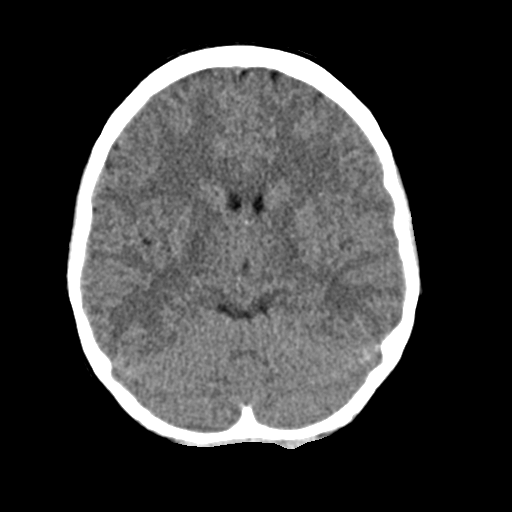
[im 14/32  bone]
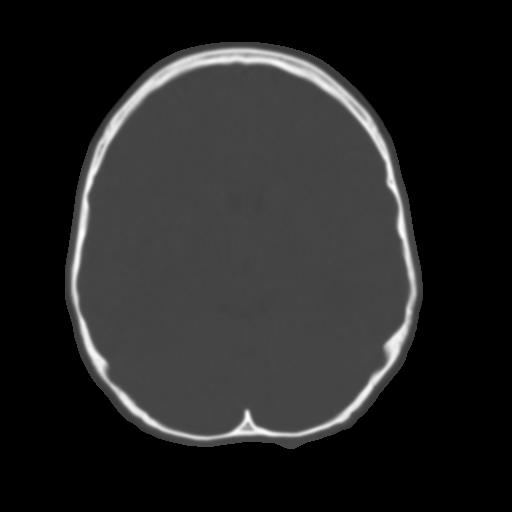
[im 18/32  brain]
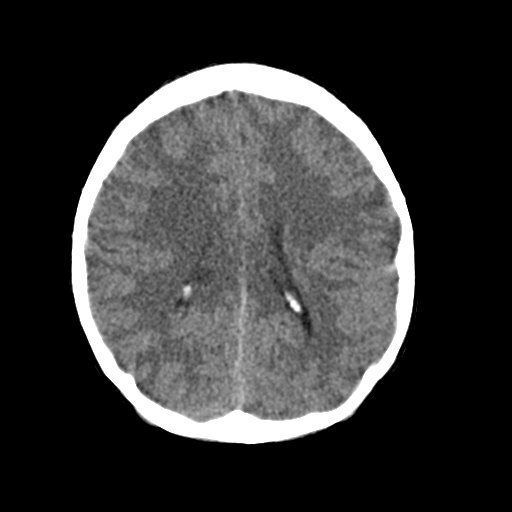
[im 21/32  brain]
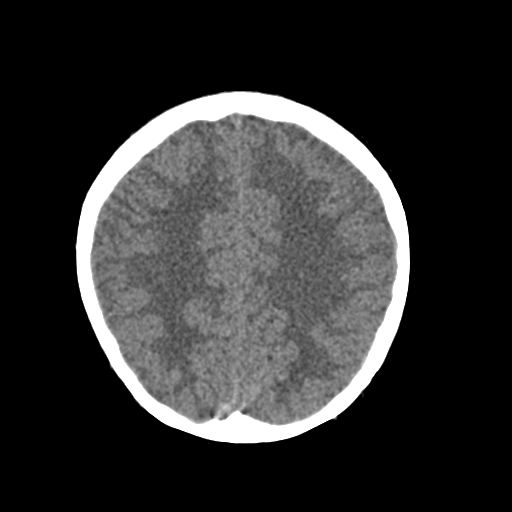
[im 24/32  brain]
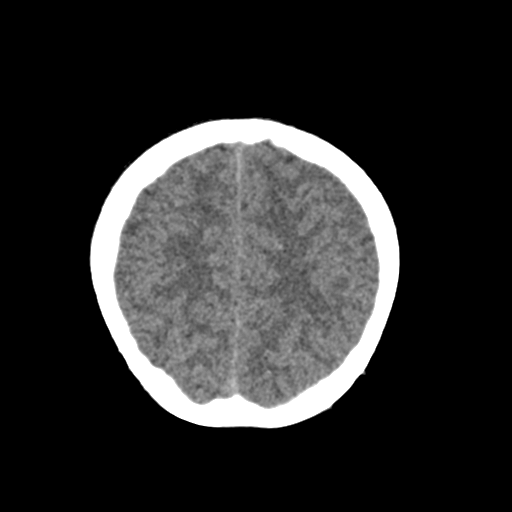
[im 26/32  brain]
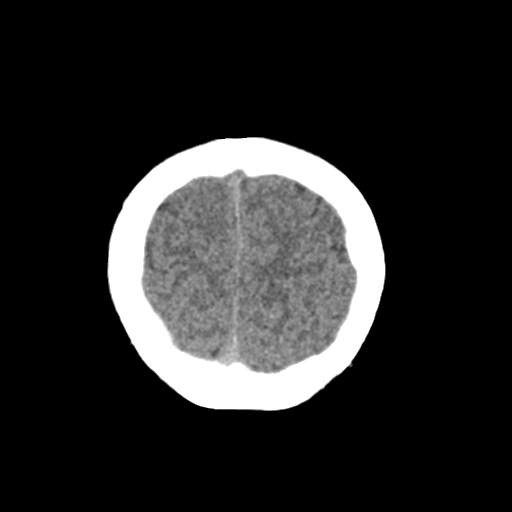
[im 26/32  bone]
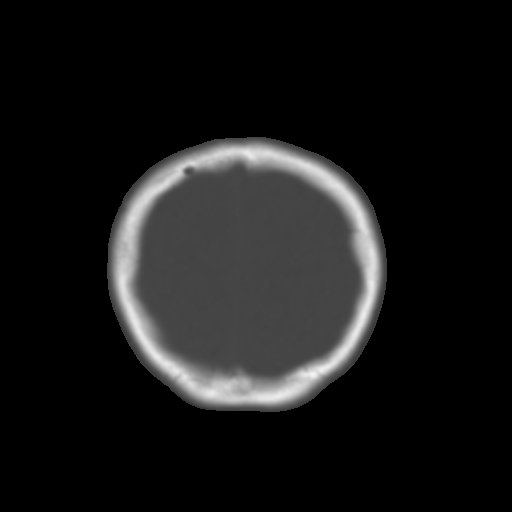
[im 29/32  brain]
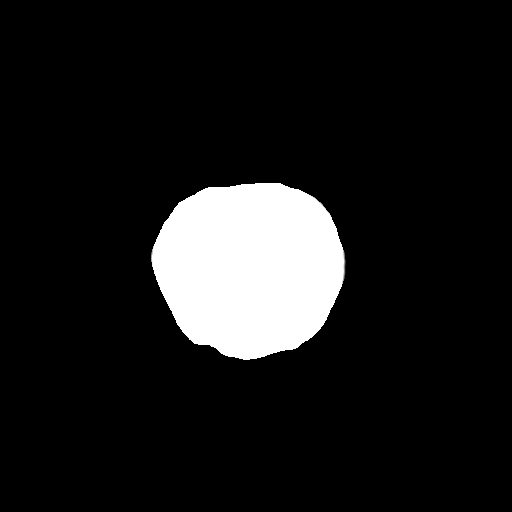

[Series 4: coronal soft · coronal · 0.30mm/px · 3 of 60 slices shown]
[im 20/60  brain]
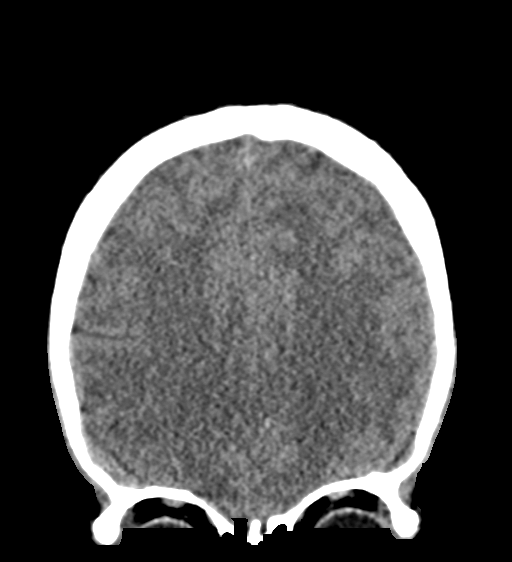
[im 27/60  brain]
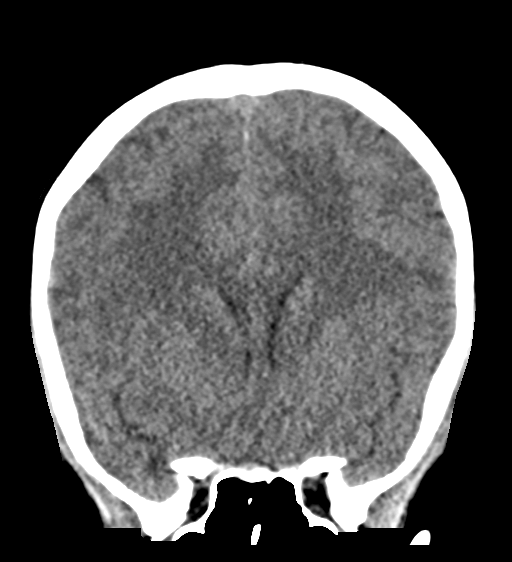
[im 33/60  brain]
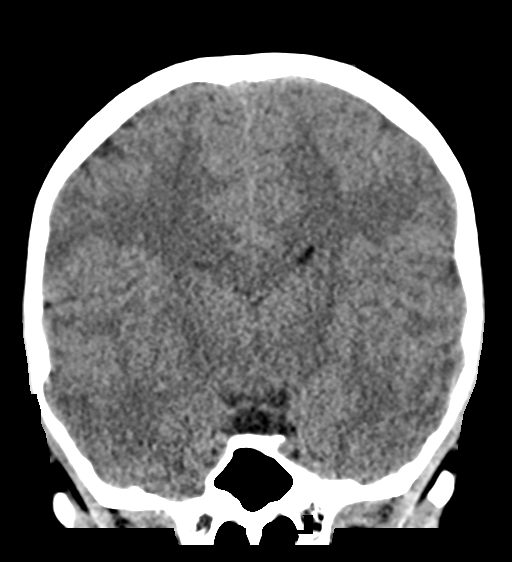

[Series 5: sag soft · sagittal · 0.32mm/px · 3 of 50 slices shown]
[im 17/50  brain]
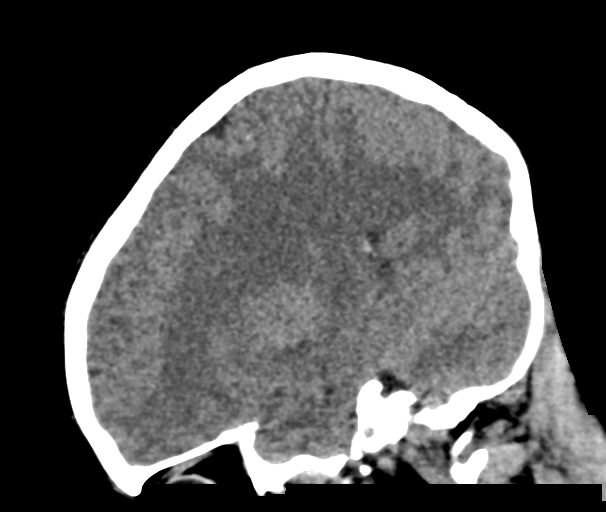
[im 25/50  brain]
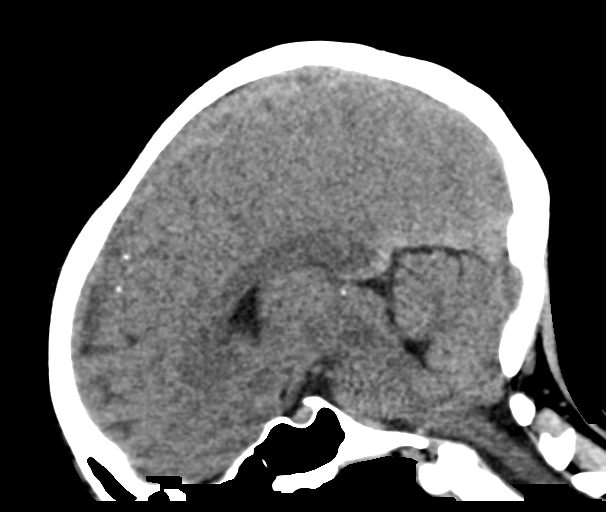
[im 33/50  brain]
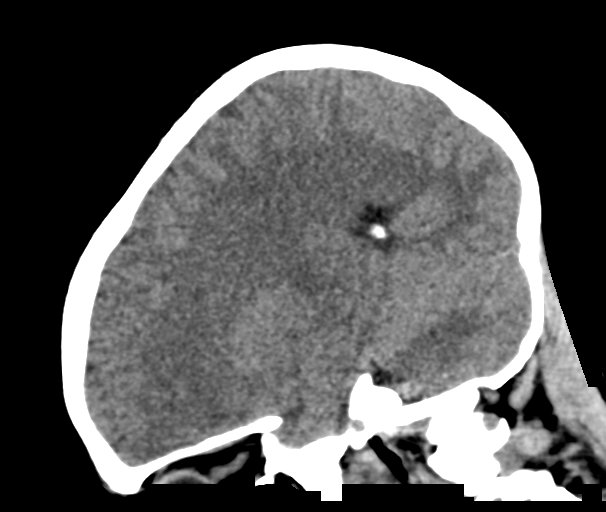

[16 of 47 positions shown; findings below may reference images not displayed]

FINDINGS: Brain: No evidence of acute infarction, hemorrhage, hydrocephalus,
extra-axial collection or mass lesion/mass effect.

Vascular: No hyperdense vessel or unexpected calcification.

Skull: Normal. Negative for fracture or focal lesion.

Sinuses/Orbits: Visualize globes orbits are unremarkable. Visualized
sinuses and mastoid air cells are clear.

Other: None.
IMPRESSION: Normal unenhanced CT scan of the brain.

## 2020-06-12 ENCOUNTER — Other Ambulatory Visit: Payer: Self-pay

## 2020-06-12 ENCOUNTER — Emergency Department
Admission: EM | Admit: 2020-06-12 | Discharge: 2020-06-13 | Disposition: A | Discharge status: Home / Self Care | Payer: Medicaid Other | Attending: Emergency Medicine | Admitting: Emergency Medicine

## 2020-06-12 DIAGNOSIS — Y9389 Activity, other specified: Secondary | ICD-10-CM | POA: Insufficient documentation

## 2020-06-12 DIAGNOSIS — F17210 Nicotine dependence, cigarettes, uncomplicated: Secondary | ICD-10-CM | POA: Insufficient documentation

## 2020-06-12 DIAGNOSIS — N390 Urinary tract infection, site not specified: Secondary | ICD-10-CM | POA: Insufficient documentation

## 2020-06-12 DIAGNOSIS — F419 Anxiety disorder, unspecified: Secondary | ICD-10-CM | POA: Insufficient documentation

## 2020-06-12 DIAGNOSIS — Y9289 Other specified places as the place of occurrence of the external cause: Secondary | ICD-10-CM | POA: Insufficient documentation

## 2020-06-12 DIAGNOSIS — W01198A Fall on same level from slipping, tripping and stumbling with subsequent striking against other object, initial encounter: Secondary | ICD-10-CM | POA: Insufficient documentation

## 2020-06-12 DIAGNOSIS — R55 Syncope and collapse: Secondary | ICD-10-CM | POA: Insufficient documentation

## 2020-06-12 DIAGNOSIS — Y998 Other external cause status: Secondary | ICD-10-CM | POA: Insufficient documentation

## 2020-06-12 LAB — CBC
HCT: 46.5 % — ABNORMAL HIGH (ref 36.0–46.0)
Hemoglobin: 16.7 g/dL — ABNORMAL HIGH (ref 12.0–15.0)
MCH: 34.8 pg — ABNORMAL HIGH (ref 26.0–34.0)
MCHC: 35.9 g/dL (ref 30.0–36.0)
MCV: 96.9 fL (ref 80.0–100.0)
Platelets: 186 10*3/uL (ref 150–400)
RBC: 4.8 MIL/uL (ref 3.87–5.11)
RDW: 12.6 % (ref 11.5–15.5)
WBC: 17.3 10*3/uL — ABNORMAL HIGH (ref 4.0–10.5)
nRBC: 0 % (ref 0.0–0.2)

## 2020-06-12 LAB — URINALYSIS, COMPLETE (UACMP) WITH MICROSCOPIC
Bilirubin Urine: NEGATIVE
Glucose, UA: NEGATIVE mg/dL
Ketones, ur: NEGATIVE mg/dL
Nitrite: POSITIVE — AB
Protein, ur: NEGATIVE mg/dL
Specific Gravity, Urine: 1.014 (ref 1.005–1.030)
pH: 5 (ref 5.0–8.0)

## 2020-06-12 LAB — BASIC METABOLIC PANEL
Anion gap: 7 (ref 5–15)
BUN: 7 mg/dL (ref 6–20)
CO2: 25 mmol/L (ref 22–32)
Calcium: 8.8 mg/dL — ABNORMAL LOW (ref 8.9–10.3)
Chloride: 105 mmol/L (ref 98–111)
Creatinine, Ser: 0.85 mg/dL (ref 0.44–1.00)
GFR calc Af Amer: >60 mL/min (ref >60)
GFR calc non Af Amer: >60 mL/min (ref >60)
Glucose, Bld: 110 mg/dL — ABNORMAL HIGH (ref 70–99)
Potassium: 3.7 mmol/L (ref 3.5–5.1)
Sodium: 137 mmol/L (ref 135–145)

## 2020-06-12 LAB — POCT PREGNANCY, URINE: Preg Test, Ur: NEGATIVE

## 2020-06-12 MED ORDER — SODIUM CHLORIDE 0.9% FLUSH: 3.0000 mL | Freq: Once | INTRAVENOUS | Status: DC

## 2020-06-12 NOTE — ED Triage Notes (Signed)
Pt in via EMS from home with c/o anxiety that caused her to have a syncopal episode where she fell and hit her head. Pt with hematoma to left front forehead. Pt anxious over dentist appt tomorrow. 98.1 temp

## 2020-06-12 NOTE — ED Triage Notes (Signed)
PT to ED via EMS from home c/o syncope. PT states waas sitting on the couch and husband states she lost consciousness and fell forward. PT does not  Remember this. Has pain to left leg up to hip and knot to head. Pt alert and oriented, appears anxious.

## 2020-06-12 NOTE — ED Notes (Signed)
Patient will be waiting outside

## 2020-06-13 ENCOUNTER — Emergency Department: Payer: Medicaid Other

## 2020-06-13 LAB — CK: Total CK: 178 U/L (ref 38–234)

## 2020-06-13 LAB — TROPONIN I (HIGH SENSITIVITY): Troponin I (High Sensitivity): 6 ng/L (ref <18)

## 2020-06-13 MED ORDER — LORAZEPAM 2 MG/ML IJ SOLN
1.0000 mg | Freq: Once | INTRAMUSCULAR | Status: AC
  Administered 2020-06-13: 02:00:00 1 mg via INTRAVENOUS
  Filled 2020-06-13: qty 1

## 2020-06-13 MED ORDER — SODIUM CHLORIDE 0.9 % IV BOLUS
1000.0000 mL | Freq: Once | INTRAVENOUS | Status: AC
  Administered 2020-06-13: 02:00:00 1000 mL via INTRAVENOUS

## 2020-06-13 MED ORDER — CEPHALEXIN 500 MG PO CAPS: 500.0000 mg | ORAL_CAPSULE | Freq: Three times a day (TID) | ORAL | 0 refills | Status: DC

## 2020-06-13 MED ORDER — SODIUM CHLORIDE 0.9 % IV SOLN
1.0000 g | Freq: Once | INTRAVENOUS | Status: AC
  Administered 2020-06-13: 02:00:00 1 g via INTRAVENOUS
  Filled 2020-06-13: qty 10

## 2020-06-13 NOTE — Discharge Instructions (Addendum)
Take antibiotic as prescribed (Keflex 500mg  twice daily x 7 days). Drink plenty of fluids daily. Return to the ER for worsening symptoms, persistent vomiting, difficulty breathing or other concerns.

## 2020-06-13 NOTE — ED Notes (Signed)
  Pt transported to ct 

## 2020-06-13 NOTE — ED Provider Notes (Signed)
[facility] Emergency Department Provider Note   ____________________________________________   First MD Initiated Contact with Patient 06/12/20 2359     (approximate)  I have reviewed the triage vital signs and the nursing notes.   HISTORY  Chief Complaint Loss of Consciousness    HPI Melanie [name] is a 28 y.o. female brought to the ED via EMS from home status post syncopal episode.  Patient has been having anxiety/panic attacks because she is having dental procedure tomorrow to remove her teeth and to be fitted for dentures.  She was vacuuming the living room, sat down on the couch and husband states she lost consciousness and fell forward, striking her forehead.  Reports muscle spasms and tenseness to her thighs bilaterally.  Denies recent illnesses.  Denies fever, cough, chest pain, shortness of breath, abdominal pain, nausea or vomiting.  States she has been having right wrist pain for the past 4 weeks.  Told it was "broken in 3 places" and had a brace on it.  Also tells me the EMT told her her "chest bone looked funny".       Past Medical History:  Diagnosis Date  . Iron deficiency     There are no problems to display for this patient.   Past Surgical History:  Procedure Laterality Date  . WISDOM TOOTH EXTRACTION      Prior to Admission medications   Medication Sig Start Date End Date Taking? Authorizing Provider  cephALEXin (KEFLEX) 500 MG capsule Take 1 capsule (500 mg total) by mouth 3 (three) times daily. 06/13/20   Irean Hong, MD  Iron Combinations (IRON COMPLEX PO) Take by mouth.    [provider]  meclizine (ANTIVERT) 25 MG tablet Take 0.5 tablet (12.5 mg) to 1 tablet (25 mg) up to 3 times a day as needed for dizziness. 07/16/17   Joy, Shawn C, PA-C  Multiple Vitamin (MULTIVITAMIN) tablet Take 1 tablet by mouth daily.    [provider]    Allergies Patient has no known allergies.  No family history on  file.  Social History Social History   Tobacco Use  . Smoking status: Current Every Day Smoker  . Smokeless tobacco: Never Used  Substance Use Topics  . Alcohol use: No  . Drug use: No    Review of Systems  Constitutional: No fever/chills Eyes: No visual changes. ENT: No sore throat. Cardiovascular: Denies chest pain. Respiratory: Denies shortness of breath. Gastrointestinal: No abdominal pain.  No nausea, no vomiting.  No diarrhea.  No constipation. Genitourinary: Negative for dysuria. Musculoskeletal: Positive for right wrist pain x4 weeks.  Negative for back pain. Skin: Negative for rash. Neurological: Positive for syncope.  Negative for headaches, focal weakness or numbness. Psychiatric: Positive for anxiety/panic attacks.  ____________________________________________   PHYSICAL EXAM:  VITAL SIGNS: ED Triage Vitals  Enc Vitals Group     BP 06/12/20 1819 118/76     Pulse Rate 06/12/20 1819 93     Resp 06/12/20 1819 20     Temp 06/12/20 1819 98.5 F (36.9 C)     Temp Source 06/12/20 1819 Oral     SpO2 06/12/20 1819 98 %     Weight 06/12/20 1822 98 lb (44.5 kg)     Height 06/12/20 1822 5\' 4"  (1.626 m)     Head Circumference --      Peak Flow --      Pain Score 06/12/20 1821 8     Pain Loc --  Pain Edu? --      Excl. in GC? --     Constitutional: Alert and oriented. Well appearing and in no acute distress. Eyes: Conjunctivae are normal. PERRL. EOMI. Head: Small left forehead hematoma. Nose: Atraumatic. Mouth/Throat: Mucous membranes are moist.  No dental malocclusion.  Widespread dental caries.   Neck: No stridor.  No cervical spine tenderness to palpation. Cardiovascular: Normal rate, regular rhythm. Grossly normal heart sounds.  Good peripheral circulation. Respiratory: Normal respiratory effort.  No retractions. Lungs CTAB.  No deformity noted to the anterior chest. Gastrointestinal: Soft and nontender to light or deep palpation. No distention. No  abdominal bruits. No CVA tenderness. Musculoskeletal: No deformity noted to right wrist.  Full range of motion without pain.  2+ radial pulses.  Brisk, less than 5-second capillary refill.  No lower extremity tenderness nor edema.  No joint effusions. Neurologic:  Normal speech and language. No gross focal neurologic deficits are appreciated. No gait instability. Skin:  Skin is warm, dry and intact. No rash noted. Psychiatric: Mood and affect are anxious.  Speech and behavior are normal.  ____________________________________________   LABS (all labs ordered are listed, but only abnormal results are displayed)  Labs Reviewed  BASIC METABOLIC PANEL - Abnormal; Notable for the following components:      Result Value   Glucose, Bld 110 (*)    Calcium 8.8 (*)    All other components within normal limits  CBC - Abnormal; Notable for the following components:   WBC 17.3 (*)    Hemoglobin 16.7 (*)    HCT 46.5 (*)    MCH 34.8 (*)    All other components within normal limits  URINALYSIS, COMPLETE (UACMP) WITH MICROSCOPIC - Abnormal; Notable for the following components:   Color, Urine YELLOW (*)    APPearance CLOUDY (*)    Hgb urine dipstick SMALL (*)    Nitrite POSITIVE (*)    Leukocytes,Ua SMALL (*)    Bacteria, UA FEW (*)    All other components within normal limits  CK  POC URINE PREG, ED  POCT PREGNANCY, URINE  TROPONIN I (HIGH SENSITIVITY)   ____________________________________________  EKG  ED ECG REPORT I, Chetara Kropp J, the attending physician, personally viewed and interpreted this ECG.   Date: 06/13/2020  EKG Time: 1811  Rate: 90  Rhythm: normal EKG, normal sinus rhythm  Axis: Normal  Intervals:none  ST&T Change: Nonspecific  ____________________________________________  RADIOLOGY  ED MD interpretation: No ICH, no acute cardiopulmonary process, no wrist fracture or dislocation  Official radiology report(s): DG Chest 2 View  Result Date: 06/13/2020 CLINICAL  DATA:  Recent syncopal episode with fall, initial encounter EXAM: CHEST - 2 VIEW COMPARISON:  07/16/2017 FINDINGS: Cardiac shadow is within normal limits. The lungs are well aerated bilaterally. No focal infiltrate or sizable effusion is seen. No acute bony abnormality is noted. IMPRESSION: No active cardiopulmonary disease. Electronically Signed   By: Alcide Clever M.D.   On: 06/13/2020 01:05   DG Wrist Complete Right  Result Date: 06/13/2020 CLINICAL DATA:  Recent fall with wrist pain, initial encounter EXAM: RIGHT WRIST - COMPLETE 3+ VIEW COMPARISON:  None. FINDINGS: There is no evidence of fracture or dislocation. There is no evidence of arthropathy or other focal bone abnormality. Soft tissues are unremarkable. IMPRESSION: No acute abnormality noted. Electronically Signed   By: Alcide Clever M.D.   On: 06/13/2020 01:05   CT Head Wo Contrast  Result Date: 06/13/2020 CLINICAL DATA:  Dizziness EXAM: CT HEAD WITHOUT CONTRAST  TECHNIQUE: Contiguous axial images were obtained from the base of the skull through the vertex without intravenous contrast. COMPARISON:  03/20/2020 FINDINGS: Brain: No evidence of acute infarction, hemorrhage, hydrocephalus, extra-axial collection or mass lesion/mass effect. Vascular: No hyperdense vessel or unexpected calcification. Skull: Normal. Negative for fracture or focal lesion. Sinuses/Orbits: No acute finding. Other: None. IMPRESSION: No acute intracranial abnormality noted. Electronically Signed   By: Alcide Clever M.D.   On: 06/13/2020 00:58    ____________________________________________   PROCEDURES  Procedure(s) performed (including Critical Care):  Procedures   ____________________________________________   INITIAL IMPRESSION / ASSESSMENT AND PLAN / ED COURSE  As part of my medical decision making, I reviewed the following data within the electronic MEDICAL RECORD NUMBER History obtained from family, Nursing notes reviewed and incorporated, Labs reviewed, EKG  interpreted, Old chart reviewed, Radiograph reviewed and Notes from prior ED visits     Mckinnley Cottier was evaluated in Emergency Department on 06/13/2020 for the symptoms described in the history of present illness. She was evaluated in the context of the global COVID-19 pandemic, which necessitated consideration that the patient might be at risk for infection with the SARS-CoV-2 virus that causes COVID-19. Institutional protocols and algorithms that pertain to the evaluation of patients at risk for COVID-19 are in a state of rapid change based on information released by regulatory bodies including the CDC and federal and state organizations. These policies and algorithms were followed during the patient's care in the ED.    29 year old female who presents status post syncopal episode.  Differential diagnosis includes but is not limited to ACS, infectious, metabolic, toxicologic etiologies, etc.  Laboratory results demonstrate leukocytosis, nitrite positive and leukocyte positive UTI.  Will add troponin, check CK.  Initiate IV fluid resuscitation, IV Rocephin for UTI, IV Ativan for anxiety and reassess.   Clinical Course as of Jun 14 255  Tue Jun 13, 2020  0159 Patient resting no acute distress.  Updated her on imaging results.  Will discharge home on Keflex.  Strongly encouraged her to keep her dental appointment today.  Strict return precautions given.  Patient verbalizes understanding and agrees with plan of care.   [JS]    Clinical Course User Index [JS] Irean Hong, MD     ____________________________________________   FINAL CLINICAL IMPRESSION(S) / ED DIAGNOSES  Final diagnoses:  Anxiety  Syncope, unspecified syncope type  Lower urinary tract infectious disease     ED Discharge Orders         Ordered    cephALEXin (KEFLEX) 500 MG capsule  3 times daily     Discontinue  Reprint     06/13/20 0159           Note:  This document was prepared using Dragon voice recognition  software and may include unintentional dictation errors.   Irean Hong, MD 06/13/20 [phone]

## 2020-07-03 ENCOUNTER — Ambulatory Visit: Payer: Medicaid Other | Admitting: Nurse Practitioner

## 2020-11-17 DIAGNOSIS — F445 Conversion disorder with seizures or convulsions: Secondary | ICD-10-CM | POA: Insufficient documentation

## 2020-11-17 DIAGNOSIS — F410 Panic disorder [episodic paroxysmal anxiety] without agoraphobia: Secondary | ICD-10-CM | POA: Insufficient documentation

## 2022-02-11 IMAGING — CR DG CHEST 2V
1 series · 2 of 2 positions shown · non-contrast
Comparison: 07/16/2017

CLINICAL DATA: Recent syncopal episode with fall, initial encounter

EXAM:
CHEST - 2 VIEW

[Series 1: dg chest 2 view · 0.14mm/px · 2 of 2 slices shown]
[im 1/2]
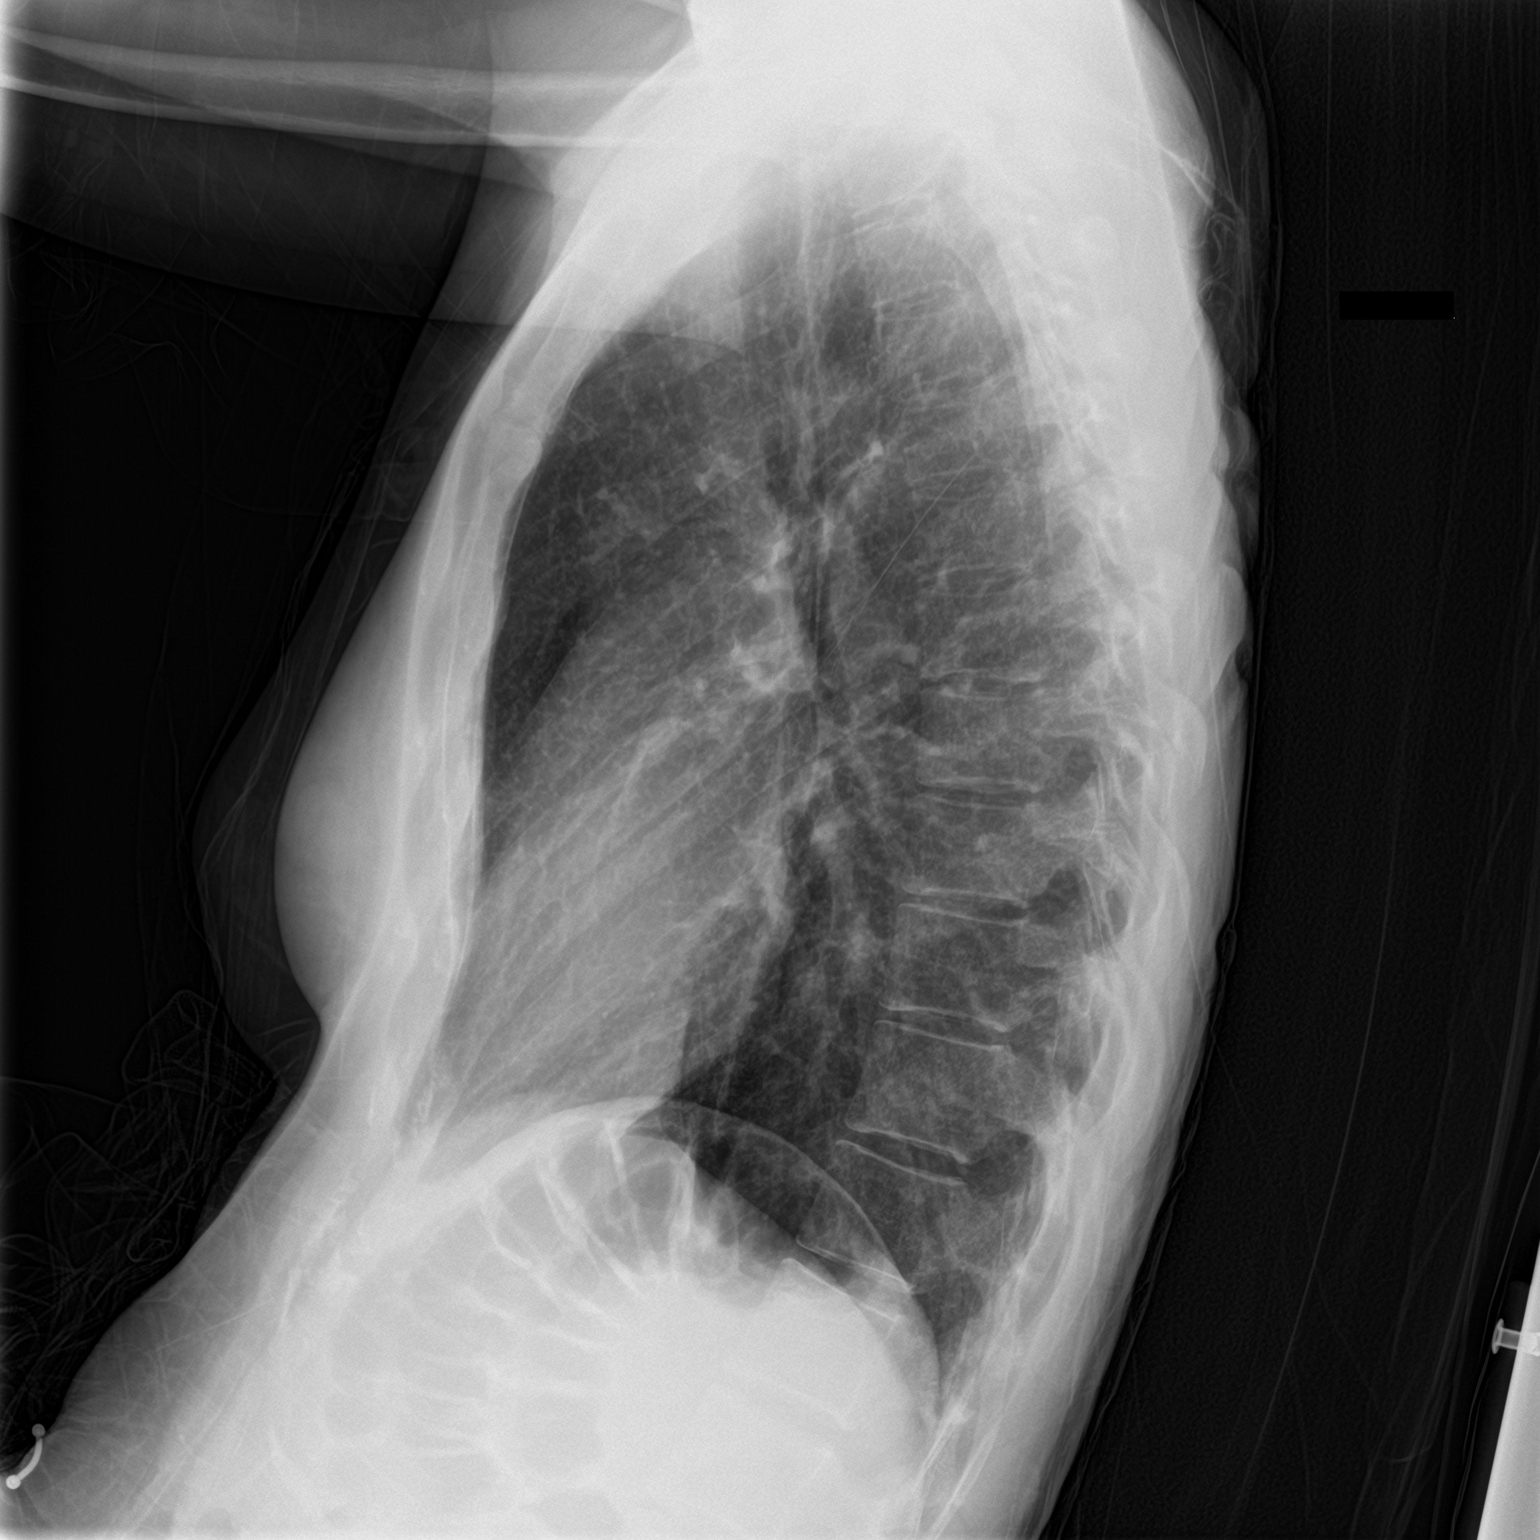
[im 2/2]
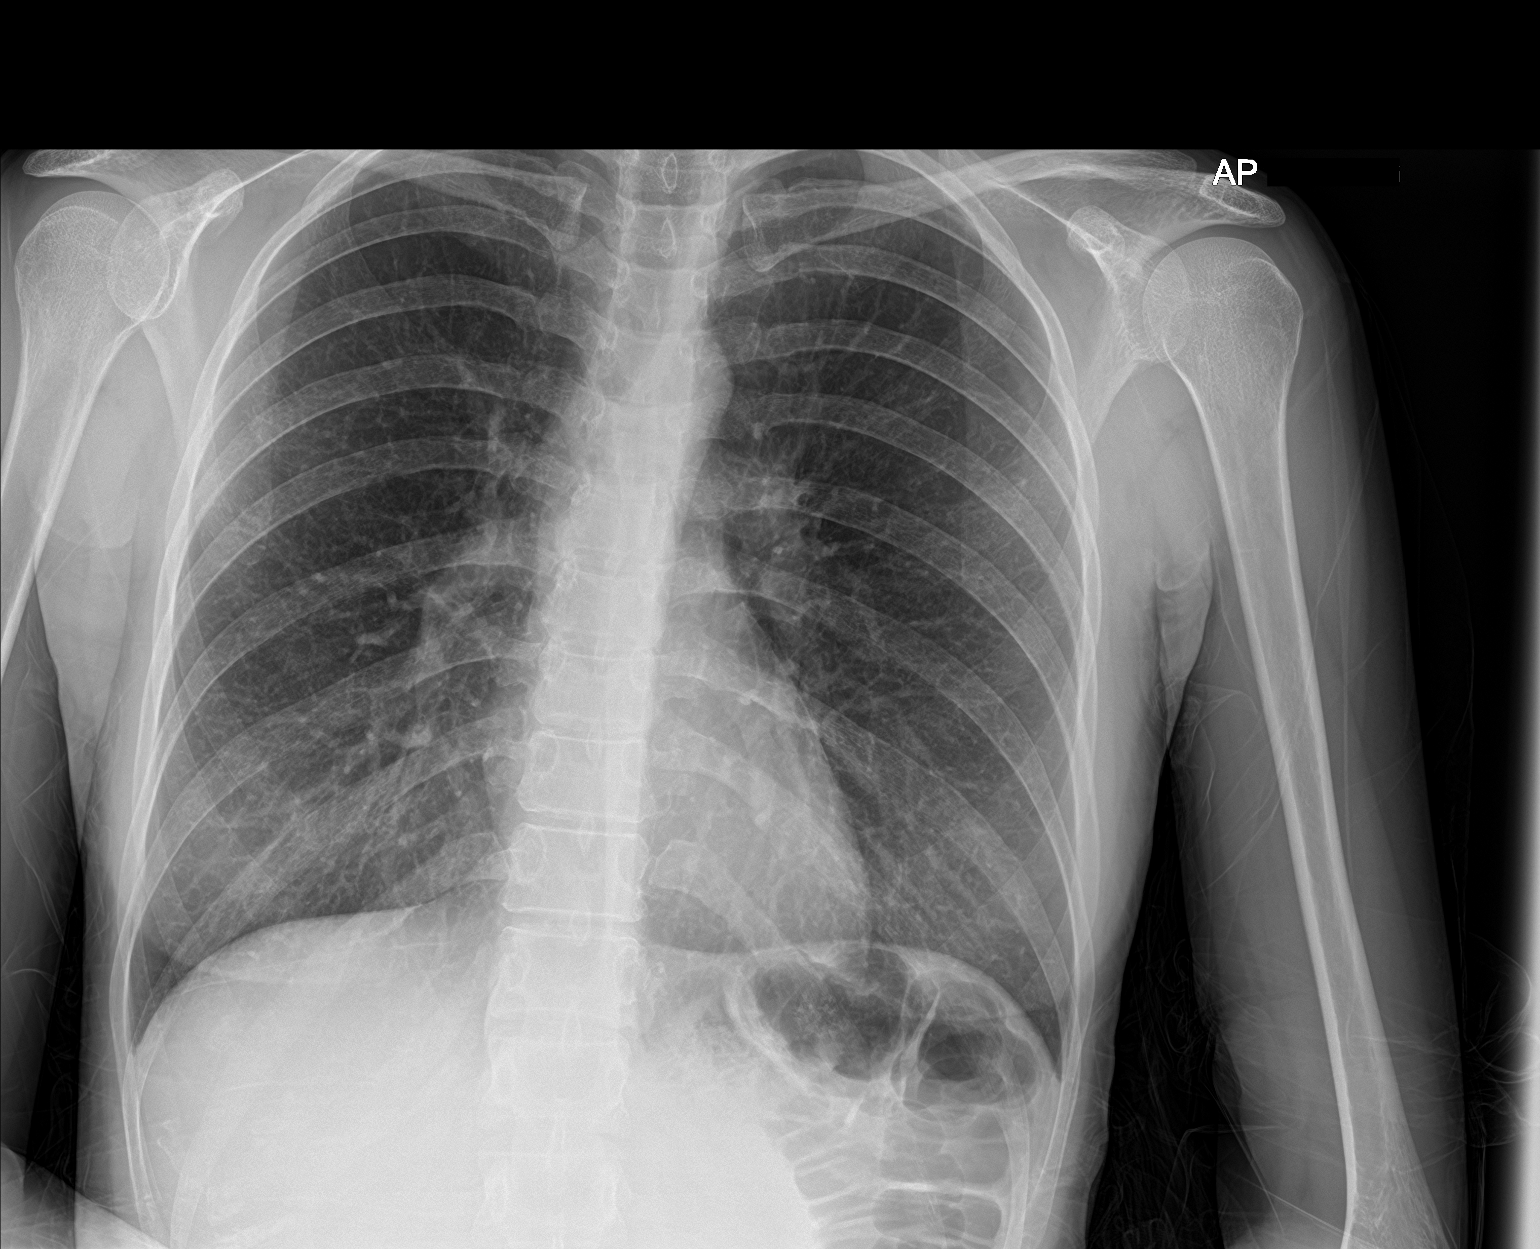

[2 of 2 positions shown; findings below may reference images not displayed]

FINDINGS: Cardiac shadow is within normal limits. The lungs are well aerated
bilaterally. No focal infiltrate or sizable effusion is seen. No
acute bony abnormality is noted.
IMPRESSION: No active cardiopulmonary disease.

## 2022-02-11 IMAGING — CT CT HEAD W/O CM
4 series · 17 of 47 positions shown, 19 images · non-contrast
Comparison: 03/20/2020

CLINICAL DATA: Dizziness

EXAM:
CT HEAD WITHOUT CONTRAST
TECHNIQUE: Contiguous axial images were obtained from the base of the skull
through the vertex without intravenous contrast.

[Series 2: head bone · axial · 0.41mm/px · z∈[-131,-83]mm · 4 of 72 slices shown]
[im 8/72  bone]
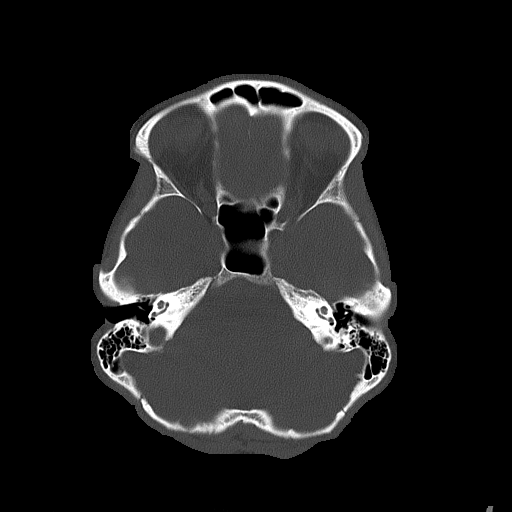
[im 15/72  bone]
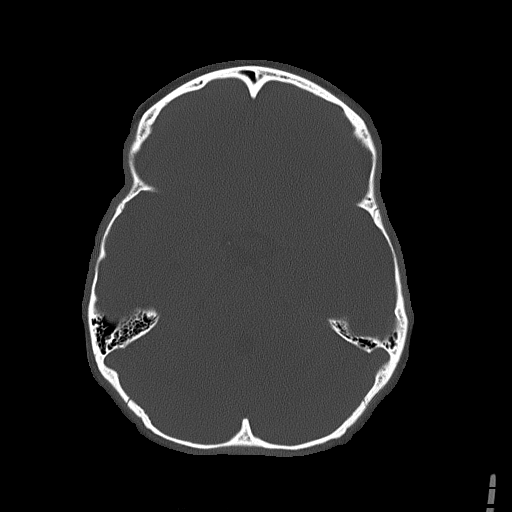
[im 22/72  bone]
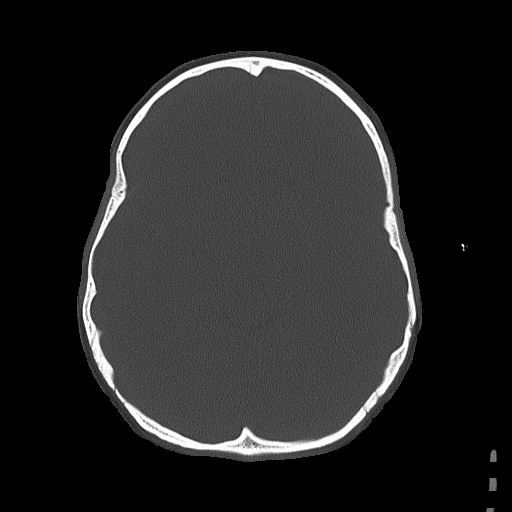
[im 32/72  bone]
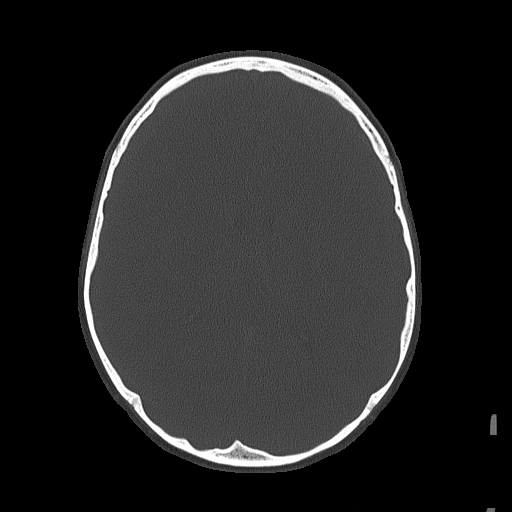

[Series 3: head wo · axial · 0.41mm/px · z∈[-130,-25]mm · 7 of 29 slices shown, 9 images]
[im 4/29  brain]
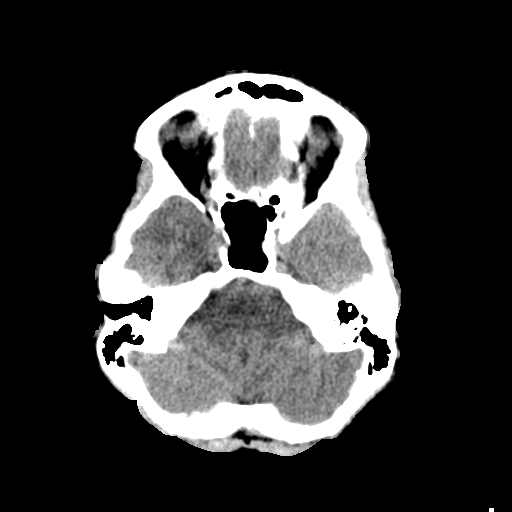
[im 4/29  bone]
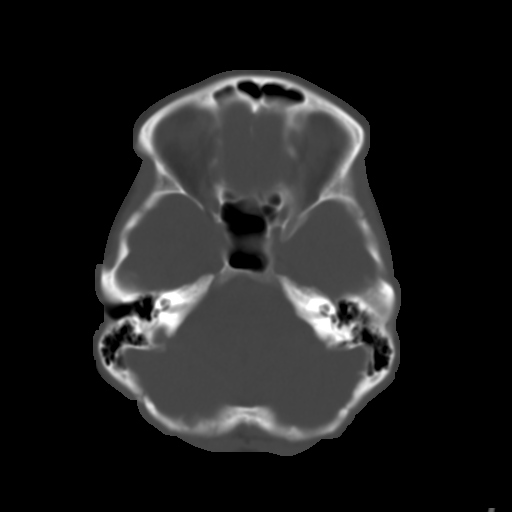
[im 8/29  brain]
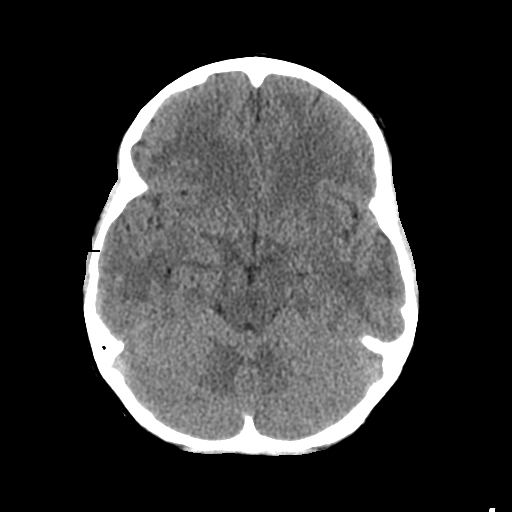
[im 11/29  brain]
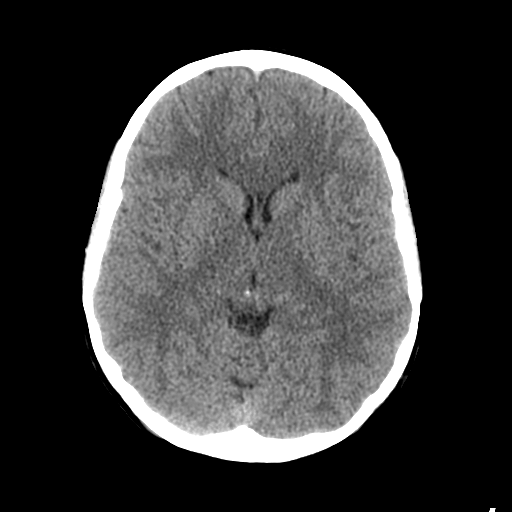
[im 15/29  brain]
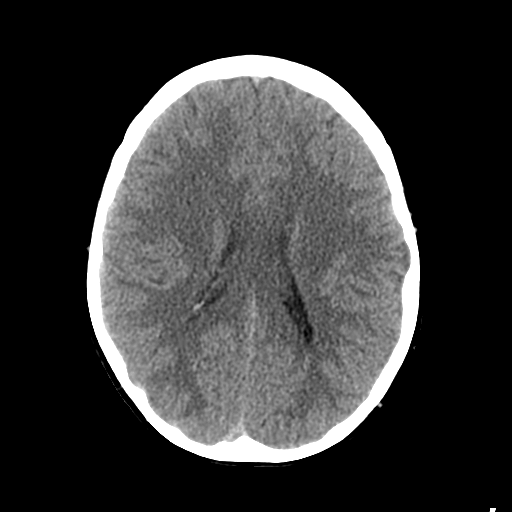
[im 18/29  brain]
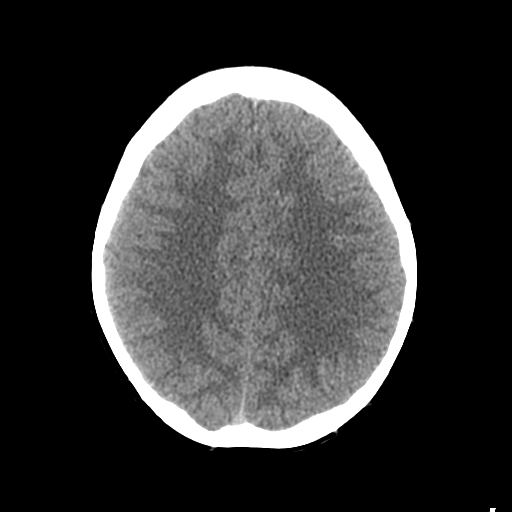
[im 18/29  bone]
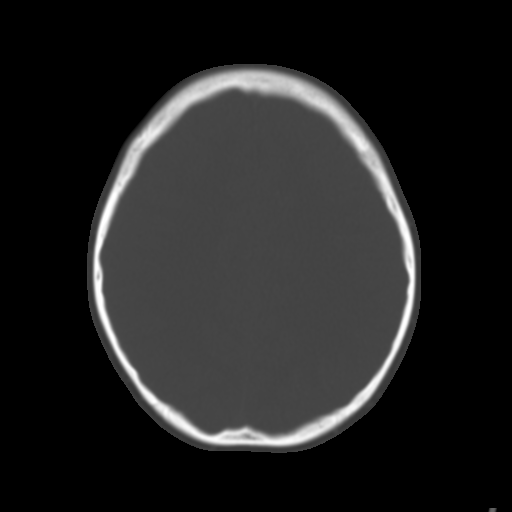
[im 22/29  brain]
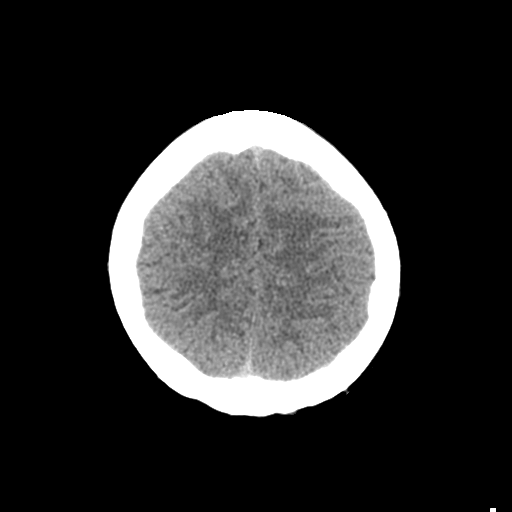
[im 25/29  brain]
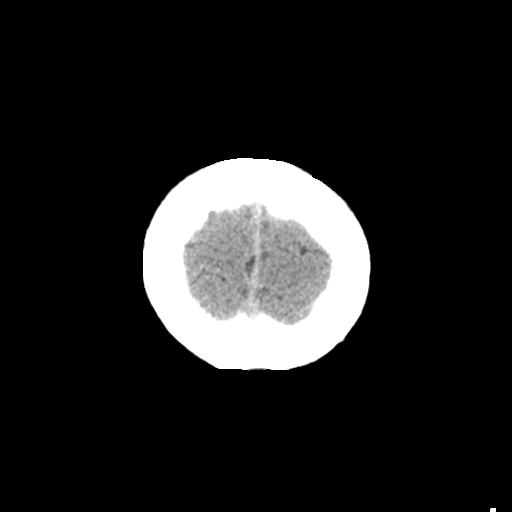

[Series 4: coronal soft tissue · coronal · 0.27mm/px · 3 of 61 slices shown]
[im 21/61  brain]
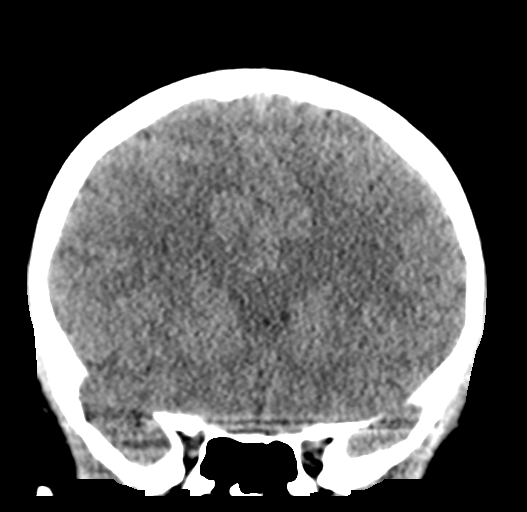
[im 27/61  brain]
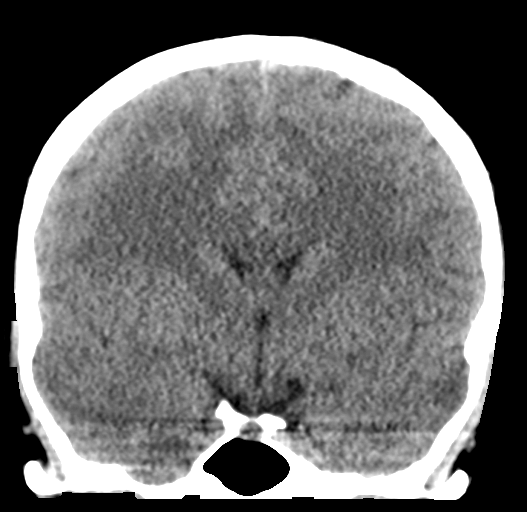
[im 34/61  brain]
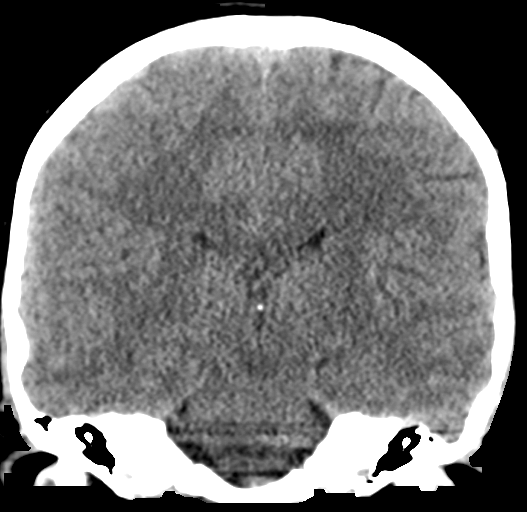

[Series 5: sagittal soft tissue · sagittal · 0.27mm/px · 3 of 48 slices shown]
[im 16/48  brain]
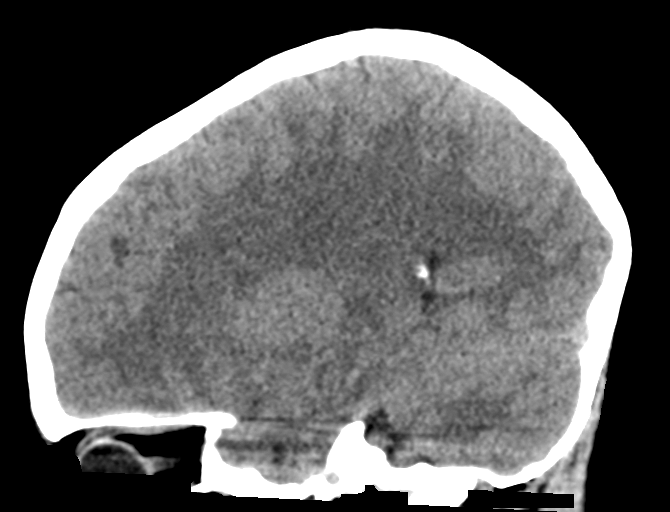
[im 24/48  brain]
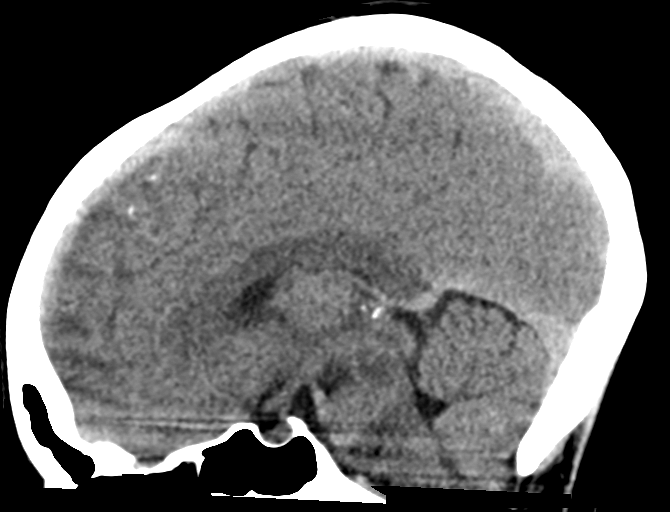
[im 32/48  brain]
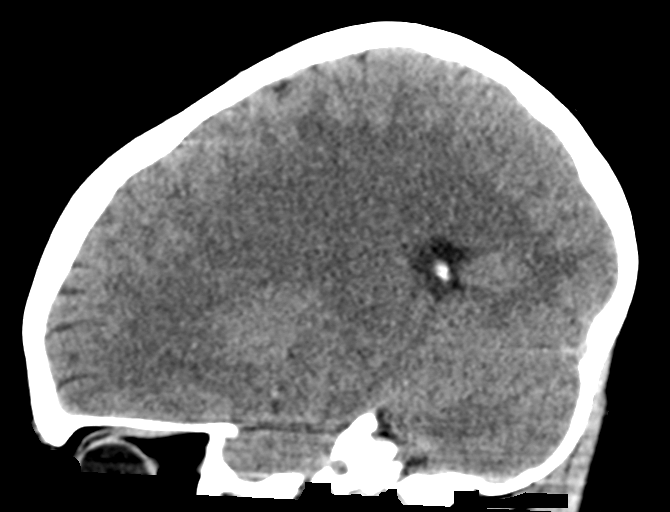

[17 of 47 positions shown; findings below may reference images not displayed]

FINDINGS: Brain: No evidence of acute infarction, hemorrhage, hydrocephalus,
extra-axial collection or mass lesion/mass effect.

Vascular: No hyperdense vessel or unexpected calcification.

Skull: Normal. Negative for fracture or focal lesion.

Sinuses/Orbits: No acute finding.

Other: None.
IMPRESSION: No acute intracranial abnormality noted.

## 2022-03-02 ENCOUNTER — Encounter: Payer: Self-pay | Admitting: Emergency Medicine

## 2022-03-02 ENCOUNTER — Other Ambulatory Visit: Payer: Self-pay

## 2022-03-02 ENCOUNTER — Emergency Department
Admission: EM | Admit: 2022-03-02 | Discharge: 2022-03-03 | Disposition: A | Discharge status: Home / Self Care | Payer: Medicaid Other | Attending: Emergency Medicine | Admitting: Emergency Medicine

## 2022-03-02 DIAGNOSIS — N390 Urinary tract infection, site not specified: Secondary | ICD-10-CM | POA: Insufficient documentation

## 2022-03-02 DIAGNOSIS — R569 Unspecified convulsions: Secondary | ICD-10-CM | POA: Insufficient documentation

## 2022-03-02 DIAGNOSIS — F329 Major depressive disorder, single episode, unspecified: Secondary | ICD-10-CM | POA: Insufficient documentation

## 2022-03-02 DIAGNOSIS — F419 Anxiety disorder, unspecified: Secondary | ICD-10-CM | POA: Insufficient documentation

## 2022-03-02 DIAGNOSIS — F410 Panic disorder [episodic paroxysmal anxiety] without agoraphobia: Secondary | ICD-10-CM | POA: Insufficient documentation

## 2022-03-02 LAB — URINALYSIS, ROUTINE W REFLEX MICROSCOPIC
Bilirubin Urine: NEGATIVE
Glucose, UA: NEGATIVE mg/dL
Ketones, ur: NEGATIVE mg/dL
Leukocytes,Ua: NEGATIVE
Nitrite: NEGATIVE
Protein, ur: NEGATIVE mg/dL
Specific Gravity, Urine: 1.013 (ref 1.005–1.030)
WBC, UA: >50 WBC/hpf — ABNORMAL HIGH (ref 0–5)
pH: 5 (ref 5.0–8.0)

## 2022-03-02 LAB — CBC
HCT: 39.5 % (ref 36.0–46.0)
Hemoglobin: 13.3 g/dL (ref 12.0–15.0)
MCH: 33.8 pg (ref 26.0–34.0)
MCHC: 33.7 g/dL (ref 30.0–36.0)
MCV: 100.5 fL — ABNORMAL HIGH (ref 80.0–100.0)
Platelets: 195 10*3/uL (ref 150–400)
RBC: 3.93 MIL/uL (ref 3.87–5.11)
RDW: 13.2 % (ref 11.5–15.5)
WBC: 9.9 10*3/uL (ref 4.0–10.5)
nRBC: 0 % (ref 0.0–0.2)

## 2022-03-02 LAB — BASIC METABOLIC PANEL
Anion gap: 6 (ref 5–15)
BUN: 9 mg/dL (ref 6–20)
CO2: 26 mmol/L (ref 22–32)
Calcium: 8.7 mg/dL — ABNORMAL LOW (ref 8.9–10.3)
Chloride: 107 mmol/L (ref 98–111)
Creatinine, Ser: 0.74 mg/dL (ref 0.44–1.00)
GFR, Estimated: >60 mL/min (ref >60)
Glucose, Bld: 92 mg/dL (ref 70–99)
Potassium: 3.7 mmol/L (ref 3.5–5.1)
Sodium: 139 mmol/L (ref 135–145)

## 2022-03-02 LAB — POC URINE PREG, ED: Preg Test, Ur: NEGATIVE

## 2022-03-02 MED ORDER — CEPHALEXIN 500 MG PO CAPS
500.0000 mg | ORAL_CAPSULE | Freq: Once | ORAL | Status: AC
  Administered 2022-03-03: 500 mg via ORAL
  Filled 2022-03-02: qty 1

## 2022-03-02 MED ORDER — LORAZEPAM 2 MG PO TABS
2.0000 mg | ORAL_TABLET | Freq: Once | ORAL | Status: AC
  Administered 2022-03-03: 2 mg via ORAL
  Filled 2022-03-02: qty 1

## 2022-03-02 MED ORDER — LORAZEPAM 2 MG/ML IJ SOLN: 1.0000 mg | Freq: Once | INTRAMUSCULAR | Status: DC

## 2022-03-02 NOTE — ED Triage Notes (Signed)
Pt presents to ER from home via EMS. Per EMS pt was fixing dinner and started to feel dizzy, blurred vision. Pt reports she was on the phone with her mom who called 911 for her. Pt reports last time she had a seizure she had similar symptoms. Her appointment with neurologist in in May for further evaluation of seizures. Per EMS 101CBG, 70hr, 100%RA , 117/78, 95/64.  ?Pt talks in complete sentences.  ?

## 2022-03-02 NOTE — ED Provider Notes (Signed)
? ?[facility] ?Provider Note ? ? ? Event Date/Time  ? First MD Initiated Contact with Patient 03/02/22 2316   ?  (approximate) ? ? ?History  ? ?Seizures ? ? ?HPI ? ?Melanie [name] is a 31 y.o. female who, according to the electronic medical record, has a history that includes anxiety, panic disorder, and "psychogenic nonepileptic seizure", who presents by EMS for possible seizure-like activity.  The patient reports that she has had multiple episodes of seizures or seizure-like activity in the past.  The worst one was a couple of months ago when she passed out and had seizure activity and "almost bit my tongue and a half".  She was at [facility] for an extended period of time while awaiting bed placement but ended up leaving AMA before a bed was found for her at a [facility].  She has not been evaluated with EEG nor MRI according to the ED record at [facility], and she states that she was prescribed Keppra by her doctor but it is too expensive for her to afford. ? ?The patient said that her episodes do seem to be related to moments of stress and anxiety in her life.  She is currently stressed and not sleeping well because her husband is in jail and he is "my safe place".  She was not feeling well tonight in terms of being anxious and worried and she called her mother.  Her mother told her that she needed to calm down because they know that she has seizures when she gets too upset and then she seemed to pass out while she was on the phone.  She says she was already sitting in bed so she did not sustain any injuries this time.  She feels a little bit achy all over and has a mild headache but overall feels okay with no specific or focal pain at this time.  She did not lose control of her bowel or bladder and did not bite her tongue or cheek. ? ?She reports being very stressed and anxious about being here in the emergency department and mentioned several times about the role that her  panic and anxiety plays into the situation.  She is also clearly frustrated about the financial constraints that have prevented her from being able to take the prescribed medication.  However she states that she has an appointment at [facility] in about a month with a neurologist. ? ?There is a history of substance abuse mentioned in one of the notes from [facility], but the patient denies any drug or alcohol use.  She states that she has not had any recent medication changes or started any new medications. ?  ? ? ?Physical Exam  ? ?Triage Vital Signs: ?ED Triage Vitals  ?Enc Vitals Group  ?   BP 03/02/22 2307 97/78  ?   Pulse Rate 03/02/22 2307 89  ?   Resp 03/02/22 2307 16  ?   Temp 03/02/22 2307 98.9 ?F (37.2 ?C)  ?   Temp Source 03/02/22 2307 Oral  ?   SpO2 03/02/22 2307 96 %  ?   Weight 03/02/22 2308 40.8 kg (90 lb)  ?   Height 03/02/22 2308 1.626 m (5\' 4" )  ?   Head Circumference --   ?   Peak Flow --   ?   Pain Score 03/02/22 2308 0  ?   Pain Loc --   ?   Pain Edu? --   ?  Excl. in GC? --   ? ? ?Most recent vital signs: ?Vitals:  ? 03/03/22 0006 03/03/22 0100  ?BP:  103/70  ?Pulse:  (!) 57  ?Resp:  14  ?Temp:    ?SpO2: 92% 99%  ? ? ? ?General: Awake, upset and anxious but not in physical or respiratory distress. ?CV:  Good peripheral perfusion.  Normal heart sounds. ?Resp:  Normal effort.  Lungs are clear to auscultation bilaterally. ?Abd:  No distention.  No abdominal tenderness.  Thin body habitus. ?Other:  Moving all 4 extremities.  Speaking clearly and easily, no gross focal neurological deficits.  Pupils are equal and reactive, extraocular movement is intact, patient has no aphasia nor dysarthria, fully alert and oriented. ? ? ?ED Results / Procedures / Treatments  ? ?Labs ?(all labs ordered are listed, but only abnormal results are displayed) ?Labs Reviewed  ?BASIC METABOLIC PANEL - Abnormal; Notable for the following components:  ?    Result Value  ? Calcium 8.7 (*)   ? All other components within normal limits   ?CBC - Abnormal; Notable for the following components:  ? MCV 100.5 (*)   ? All other components within normal limits  ?URINALYSIS, ROUTINE W REFLEX MICROSCOPIC - Abnormal; Notable for the following components:  ? Color, Urine YELLOW (*)   ? APPearance HAZY (*)   ? Hgb urine dipstick MODERATE (*)   ? WBC, UA >50 (*)   ? Bacteria, UA MANY (*)   ? All other components within normal limits  ?URINE DRUG SCREEN, QUALITATIVE ([facility] ONLY) - Abnormal; Notable for the following components:  ? Benzodiazepine, Ur Scrn POSITIVE (*)   ? All other components within normal limits  ?CBG MONITORING, ED - Abnormal; Notable for the following components:  ? Glucose-Capillary 103 (*)   ? All other components within normal limits  ?URINE CULTURE  ?ETHANOL  ?LACTIC ACID, PLASMA  ?POC URINE PREG, ED  ? ? ? ?EKG ? ?ED ECG REPORT ?ILoleta Rose, Buddie Marston, the attending physician, personally viewed and interpreted this ECG. ? ?Date: 03/02/2022 ?EKG Time: 23: 09 ?Rate: 78 ?Rhythm: normal sinus rhythm with sinus arrhythmia ?QRS Axis: normal ?Intervals: normal ?ST/T Wave abnormalities: normal ?Narrative Interpretation: no evidence of acute ischemia ? ? ? ?RADIOLOGY ?No indication for emergent imaging ? ? ? ?PROCEDURES: ? ?Critical Care performed: No ? ?.1-3 Lead EKG Interpretation ?Performed by: Loleta RoseForbach, Jassiah Viviano, MD ?Authorized by: Loleta RoseForbach, Osie Merkin, MD  ? ?  Interpretation: normal   ?  ECG rate:  85 ?  ECG rate assessment: normal   ?  Rhythm: sinus rhythm   ?  Ectopy: none   ?  Conduction: normal   ? ? ?MEDICATIONS ORDERED IN ED: ?Medications  ?cephALEXin (KEFLEX) capsule 500 mg (500 mg Oral Given 03/03/22 0015)  ?LORazepam (ATIVAN) tablet 2 mg (2 mg Oral Given 03/03/22 0014)  ? ? ? ?IMPRESSION / MDM / ASSESSMENT AND PLAN / ED COURSE  ?I reviewed the triage vital signs and the nursing notes. ?             ?               ? ?Differential diagnosis includes, but is not limited to, psychogenic nonepileptic seizures, epileptic seizure, metabolic or electrolyte  abnormality, medication or drug side effect. ? ?Patient's vital signs are stable and within normal limits.  Her blood pressure is on the low end of normal, but I [facility] and see that her blood pressure  was consistently in the 90s and even 80s systolic at times.  I suspect this is her baseline and not representative of an acute issue.   ? ?She is not reporting any specific physical complaints.  She focuses largely on her stress, anxiety, and panic attacks.  She states that she has been told in the past that she has "stress-induced seizures", which correlates with the information from her medical record from the [facility] visit in 01/04/2022.   ? ?I also reviewed a clinic visit with psychiatry (Drs. Edythe Lynn and Barnetta Chapel) from [facility] on 12/20/2021, and they mentioned her history of generalized anxiety disorder and major depressive disorder, as well as the patient reporting that she takes "street Xanax about once a week".  The patient is denying this at this time.  At the time of the telemedicine psychiatry visit, the patient was restarted on trazodone and Lexapro but it does not sound as though she has been able to continue taking these medications as an outpatient. ? ?Based on all the information listed in her current physical exam, I feel there is no indication for a CT scan of the head.  I have ordered basic labs including BMP, CBC, urinalysis, urine drug screen, urine pregnancy test, and ethanol level.  The lactic acid may help me determine if she had seizure-like activity but anticipated will be within normal limits.  Medication for IV fluids at this time.  Given that the patient's main issue right now seems to be anxiety and she seems very stressed and concerned about being here in the emergency department, I will give her 1 mg of Ativan IV to help her calm down, which will also be helpful for seizure prophylaxis if in fact she is having actual epileptiform seizures. ? ?The  patient is on the cardiac monitor to evaluate for evidence of arrhythmia and/or significant heart rate changes. ? ?Of note, family or friends arrived shortly after I departed from the room which may help her dur

## 2022-03-03 LAB — ETHANOL: Alcohol, Ethyl (B): <10 mg/dL (ref <10)

## 2022-03-03 LAB — URINE DRUG SCREEN, QUALITATIVE (ARMC ONLY)
Amphetamines, Ur Screen: NOT DETECTED
Barbiturates, Ur Screen: NOT DETECTED
Benzodiazepine, Ur Scrn: POSITIVE — AB
Cannabinoid 50 Ng, Ur ~~LOC~~: NOT DETECTED
Cocaine Metabolite,Ur ~~LOC~~: NOT DETECTED
MDMA (Ecstasy)Ur Screen: NOT DETECTED
Methadone Scn, Ur: NOT DETECTED
Opiate, Ur Screen: NOT DETECTED
Phencyclidine (PCP) Ur S: NOT DETECTED
Tricyclic, Ur Screen: NOT DETECTED

## 2022-03-03 LAB — LACTIC ACID, PLASMA: Lactic Acid, Venous: 1 mmol/L (ref 0.5–1.9)

## 2022-03-03 LAB — CBG MONITORING, ED: Glucose-Capillary: 103 mg/dL — ABNORMAL HIGH (ref 70–99)

## 2022-03-03 MED ORDER — CEFADROXIL 500 MG PO CAPS: 500.0000 mg | ORAL_CAPSULE | Freq: Two times a day (BID) | ORAL | 0 refills | Status: AC

## 2022-03-03 NOTE — Discharge Instructions (Signed)
As we discussed, your seizure-like activity is most likely primarily the results of your stress and anxiety.  You also have a urinary tract infection and we encourage you to fill your prescription for the antibiotic and complete the full course of treatment (twice a day for 1 week).  We recommend you follow-up with your psychiatrist in [facility] as well as having the appointment with the neurologist as scheduled. ? ?Continue to avoid any nonprescribed drugs and alcohol.  Take the medications as previously prescribed by your therapist.  Return to the emergency department if you develop new or worsening symptoms that concern you. ?

## 2022-03-05 LAB — URINE CULTURE

## 2023-08-05 ENCOUNTER — Ambulatory Visit: Payer: Medicaid Other | Admitting: Pediatrics

## 2023-08-14 ENCOUNTER — Ambulatory Visit: Payer: Medicaid Other | Admitting: Pediatrics

## 2023-08-29 ENCOUNTER — Ambulatory Visit (LOCAL_COMMUNITY_HEALTH_CENTER): Payer: Self-pay

## 2023-08-29 DIAGNOSIS — Z111 Encounter for screening for respiratory tuberculosis: Secondary | ICD-10-CM

## 2023-09-01 ENCOUNTER — Ambulatory Visit (LOCAL_COMMUNITY_HEALTH_CENTER): Payer: Self-pay

## 2023-09-01 DIAGNOSIS — Z111 Encounter for screening for respiratory tuberculosis: Secondary | ICD-10-CM

## 2023-09-01 LAB — TB SKIN TEST
Induration: 0 mm
TB Skin Test: NEGATIVE

## 2023-10-02 ENCOUNTER — Telehealth: Payer: Self-pay | Admitting: Pediatrics

## 2023-10-02 ENCOUNTER — Ambulatory Visit: Payer: Medicaid Other | Admitting: Pediatrics

## 2023-10-02 ENCOUNTER — Encounter: Payer: Self-pay | Admitting: Pediatrics

## 2023-10-02 VITALS — BP 100/69 | HR 80 | Temp 97.8°F | Ht 64.0 in | Wt 95.2 lb

## 2023-10-02 DIAGNOSIS — Z7689 Persons encountering health services in other specified circumstances: Secondary | ICD-10-CM

## 2023-10-02 DIAGNOSIS — J189 Pneumonia, unspecified organism: Secondary | ICD-10-CM

## 2023-10-02 DIAGNOSIS — Z1330 Encounter for screening examination for mental health and behavioral disorders, unspecified: Secondary | ICD-10-CM

## 2023-10-02 DIAGNOSIS — N63 Unspecified lump in unspecified breast: Secondary | ICD-10-CM

## 2023-10-02 DIAGNOSIS — D509 Iron deficiency anemia, unspecified: Secondary | ICD-10-CM | POA: Diagnosis not present

## 2023-10-02 DIAGNOSIS — F17210 Nicotine dependence, cigarettes, uncomplicated: Secondary | ICD-10-CM

## 2023-10-02 DIAGNOSIS — Z716 Tobacco abuse counseling: Secondary | ICD-10-CM

## 2023-10-02 MED ORDER — AZITHROMYCIN 250 MG PO TABS
ORAL_TABLET | ORAL | 0 refills | Status: AC
Start: 2023-10-02 — End: 2023-10-07

## 2023-10-02 MED ORDER — ALBUTEROL SULFATE HFA 108 (90 BASE) MCG/ACT IN AERS
2.0000 | INHALATION_SPRAY | Freq: Four times a day (QID) | RESPIRATORY_TRACT | 2 refills | Status: DC | PRN
Start: 2023-10-02 — End: 2024-04-29

## 2023-10-02 NOTE — Patient Instructions (Signed)
Good to meet you! Welcome to [facility]!  As your primary care doctor, I look forward to working with you to help you reach your health goals.  Please be aware of a couple of logistical items: - If you message me on mychart, it may take me 1-2 business days to get back to you. This is for non-urgent messaging.  - If you require urgent clinical attention, please call the clinic or present to urgent care/emergency room - If you have labs, I typically will send a message about them in 1-2 business days. - I am not here on Mondays, otherwise will be available from Tuesday-Friday during 8a-5pm.

## 2023-10-02 NOTE — Telephone Encounter (Signed)
Dr. Evelene Croon sent in the medication, I called and informed patient that her prescriptions had been sent to her pharmacy.  Pt verbalized understanding.

## 2023-10-02 NOTE — Telephone Encounter (Signed)
Medication Refill -  Most Recent Primary Care Visit:  Provider: [name]  Department: [facility]  Visit Type: NEW PATIENT  Date: 10/02/2023  Medication: Metered Dose Inhaler   Has the patient contacted their pharmacy? No Pharmacist stated they did not have a script for the inhaler. The medication was suppose to be prescribe for the first time at her visit today  Is this the correct pharmacy for this prescription? Yes If no, delete pharmacy and type the correct one.  This is the patient's preferred pharmacy:  [id number] - [place], [place] - [address] AT [facility] [address] [place] [address] [place] [address] Phone: [phone] Fax: [phone]   Has the prescription been filled recently? No  Is the patient out of the medication? Yes  Has the patient been seen for an appointment in the last year OR does the patient have an upcoming appointment? Yes  Can we respond through MyChart? Yes  Agent: Please be advised that Rx refills may take up to 3 business days. We ask that you follow-up with your pharmacy.

## 2023-10-02 NOTE — Progress Notes (Signed)
Establish Care Note  BP 100/69 (BP Location: Left Arm, Patient Position: Sitting, Cuff Size: Normal)   Pulse 80   Temp 97.8 F (36.6 C) (Oral)   Ht 5\' 4"  (1.626 m)   Wt 95 lb 3.2 oz (43.2 kg)   SpO2 98%   BMI 16.34 kg/m    Subjective:    Patient ID: Melanie [name], female    DOB: 09/21/1991, 32 y.o.   MRN: [id number]  HPI: Melanie [name] is a 32 y.o. female  Chief Complaint  Patient presents with   Establish Care   Cough    Started a couple weeks ago, rattle in chest, coughing up phlegm, prior treatment has been OTC muccinex, cough syrups, but has been ineffective     Establishing care, the following was discussed today:  Discussed the use of AI scribe software for clinical note transcription with the patient, who gave verbal consent to proceed.  History of Present Illness   The patient, a 32 year old with a history of iron deficiency, presented with a persistent cough of approximately two months' duration. The cough has been productive, with the patient describing the expectorate as similar to snot, with a brown and green color. The cough has been associated with a rattling sensation in the chest, and has been severe enough to induce gagging. The patient reported that the cough is worse in the mornings and at night, and has not improved despite trials of over-the-counter cough medications and Mucinex.  The patient also reported a sensation of breathlessness, particularly noticeable when performing routine activities such as walking to the kitchen. This breathlessness has been associated with a sensation of pressure in the chest, described as feeling like an 'elephant sitting on Melanie chest.' The patient also reported occasional wheezing, but does not have an inhaler.  The patient has a history of smoking, currently half a pack of cigarettes per day. They attempted to quit smoking approximately a year ago following dental surgery, but resumed after a month and a half.  In addition to the  respiratory symptoms, the patient reported a painful lump in their breast. The lump was described as a knot, and was painful to touch. The patient's grandmother had a history of breast cancer, but testing indicated that it was not a hereditary form.  The patient also reported a lack of energy and difficulty gaining weight, despite a diet that includes regular meals. They have been taking iron supplements for an iron deficiency, and also take B12 supplements in an attempt to boost energy levels. The patient reported that they consume a significant amount of [facility] and do not eat a particularly healthy diet.  The patient has a history of anxiety and described themselves as a hypochondriac, often cancelling medical appointments due to fear of what might be found. They reported a fear of blood draws and have previously fainted during a TB test.      #HM Immunizations: due for tdap, flu, covid, declined. Will readdress at physical. Cervical cancer screen: overdue  Relevant past medical, surgical, family and social history reviewed and updated as indicated. Interim medical history since our last visit reviewed. Allergies and medications reviewed and updated.  ROS per HPI unless specifically indicated above     Objective:    BP 100/69 (BP Location: Left Arm, Patient Position: Sitting, Cuff Size: Normal)   Pulse 80   Temp 97.8 F (36.6 C) (Oral)   Ht 5\' 4"  (1.626 m)   Wt 95 lb 3.2 oz (43.2 kg)  SpO2 98%   BMI 16.34 kg/m   Wt Readings from Last 3 Encounters:  10/02/23 95 lb 3.2 oz (43.2 kg)  03/02/22 90 lb (40.8 kg)  06/12/20 98 lb (44.5 kg)     Physical Exam Constitutional:      Appearance: Normal appearance.  HENT:     Head: Normocephalic and atraumatic.  Eyes:     Pupils: Pupils are equal, round, and reactive to light.  Cardiovascular:     Rate and Rhythm: Normal rate and regular rhythm.     Pulses: Normal pulses.     Heart sounds: Normal heart sounds.  Pulmonary:      Effort: Pulmonary effort is normal.     Breath sounds: Normal breath sounds.  Abdominal:     General: Abdomen is flat.     Palpations: Abdomen is soft.  Musculoskeletal:        General: Normal range of motion.     Cervical back: Normal range of motion.  Skin:    General: Skin is warm and dry.     Capillary Refill: Capillary refill takes less than 2 seconds.  Neurological:     General: No focal deficit present.     Mental Status: She is alert. Mental status is at baseline.  Psychiatric:        Mood and Affect: Mood normal.        Behavior: Behavior normal.         10/02/2023   10:18 AM  Depression screen PHQ 2/9  Decreased Interest 2  Down, Depressed, Hopeless 1  PHQ - 2 Score 3  Altered sleeping 0  Tired, decreased energy 3  Change in appetite 1  Feeling bad or failure about yourself  1  Trouble concentrating 0  Moving slowly or fidgety/restless 0  Suicidal thoughts 0  PHQ-9 Score 8        10/02/2023   10:19 AM  GAD 7 : Generalized Anxiety Score  Nervous, Anxious, on Edge 2  Control/stop worrying 2  Worry too much - different things 3  Trouble relaxing 1  Restless 1  Easily annoyed or irritable 3  Afraid - awful might happen 1  Total GAD 7 Score 13       Assessment & Plan:  Assessment & Plan   Encounter to establish care Reviewed patient record including history, medications, problem list. HM updated as able. Will bring records and will fill HM gaps as needed at follow up visit.  Iron deficiency anemia, unspecified iron deficiency anemia type Assessment & Plan: Patient reports taking iron supplements daily. No recent labs to confirm current status. -Continue iron supplements.  - will check at physical  Atypical pneumonia Persistent cough with productive sputum, dyspnea, and chest discomfort for approximately two months. Likely bacterial given the duration and symptoms. Suspect some degree of COPD and/or underlying inflammation contributing to  persistence of symptoms. Distant rales on exam, some wheezing cleared. Plan to treat for atypical pneumonia and reassess. -Start Azithromycin (Z-Pak). -Prescribe inhaler. -Continue Mucinex or any over-the-counter cough medicine with DM. -Check in 5 days to assess response to treatment. -     Azithromycin; Take 2 tablets on day 1, then 1 tablet daily on days 2 through 5  Dispense: 6 tablet; Refill: 0 -     Albuterol Sulfate HFA; Inhale 2 puffs into the lungs every 6 (six) hours as needed for wheezing or shortness of breath.  Dispense: 8 g; Refill: 2  Mass of breast, unspecified laterality Patient reports a  painful mass in the breast. Physical examination suggests it may be muscular in nature, possibly a rib, but further imaging is reasonable for a definitive diagnosis. -Order breast ultrasound. -     US SOFT TISSUE HEAD & NECK (NON-THYROID); Future  Tobacco abuse counseling Cigarette nicotine dependence without complication Assessment & Plan: Discussed cessation and available treatment including nicotine replacement options, pharmacologic treatment, and/or online resources. Based on our discussion, she does not want to initiate treatment today. Will continue to monitor.  Encounter for behavioral health screening As part of their intake evaluation, the patient was screened for depression, anxiety.  PHQ9 SCORE 8, GAD7 SCORE 13. Screening results positive for tested conditions. No acute safety concerns, will further address at follow up visit.  Follow up plan: Return in about 19 days (around 10/21/2023) for Physical.  Melanie [name] Howell Pringle, MD

## 2023-10-02 NOTE — Assessment & Plan Note (Signed)
Discussed cessation and available treatment including nicotine replacement options, pharmacologic treatment, and/or online resources. Based on our discussion, she does not want to initiate treatment today. Will continue to monitor.

## 2023-10-02 NOTE — Telephone Encounter (Signed)
This encounter was created in error - please disregard.

## 2023-10-02 NOTE — Telephone Encounter (Signed)
Patient called and asked how long it will be before she get the inhaler. She says she called earlier and was told it would be sent to the doctor. Advised the request was sent to the provider. She wants to know the time frame. I placed her on hold and called to speak to [place], [facility] who says Dr. Evelene Croon is in with a patient and to let Makhyla know as soon as she's out, she will speak to her about the inhaler and someone will call her back. Patient advised, she verbalized understanding.

## 2023-10-02 NOTE — Assessment & Plan Note (Signed)
Patient reports taking iron supplements daily. No recent labs to confirm current status. -Continue iron supplements.  - will check at physical

## 2023-10-02 NOTE — Telephone Encounter (Signed)
Pt states she is still waiting on an inhaler from her dr visit today as a new pt.  Pt does not know which inhaler this might be. [id number] - [place], [place] - [address] AT [facility] [address] [facility]

## 2023-10-21 ENCOUNTER — Encounter: Payer: Medicaid Other | Admitting: Pediatrics

## 2023-10-21 NOTE — Progress Notes (Unsigned)
There were no vitals taken for this visit.   Annual Physical Exam - Female  Subjective:   CC: No chief complaint on file.   Melanie [name] is a 32 y.o. female patient here for a preventative health maintenance exam{Blank Single :19197::" and has no acute complaints.",". Additional topics discussed include:"}  Health Habits: DIET: {Desc; diets:16563} EXERCISE: *** times/week on average, activities include {misc; exercise types:16438} DENTAL EXAM: {UTDSTATUS:31041} EYE EXAM: {UTDSTATUS:31041}                       Relevant Gynecologic History LMP: No LMP recorded.  Menstrual Status: {Menopause:31378}, Flow {Misc; menses description:16152} PAP History:  No Cervical Cancer Screening results to display.  History abnormal PAP: {yes/no:20286}  Sexual activity: {sexual partners:315163} Family history breast, ovarian cancer: {yes/no:20286} Domestic Violence Screen, feels safe at home: {yes/no:20286}  family history is not on file.  Social History   Tobacco Use   Smoking status: Every Day   Smokeless tobacco: Never  Substance Use Topics   Alcohol use: No   Drug use: No   Social History   Social History Narrative   Not on file    Social drivers questionnaire is reviewed and is positive for : {SDOH Challenges:24934}. Follow up: {sdoh follow up:67204::"None"}  Depression Screening:     10/02/2023   10:18 AM  Depression screen PHQ 2/9  Decreased Interest 2  Down, Depressed, Hopeless 1  PHQ - 2 Score 3  Altered sleeping 0  Tired, decreased energy 3  Change in appetite 1  Feeling bad or failure about yourself  1  Trouble concentrating 0  Moving slowly or fidgety/restless 0  Suicidal thoughts 0  PHQ-9 Score 8       10/02/2023   10:19 AM  GAD 7 : Generalized Anxiety Score  Nervous, Anxious, on Edge 2  Control/stop worrying 2  Worry too much - different things 3  Trouble relaxing 1  Restless 1  Easily annoyed or irritable 3  Afraid - awful might happen 1   Total GAD 7 Score 13    Mental Health Plan: {[phone]}  Self Management Goals  Goals   None     Health Maintenance Colon Cancer Screening : {UTDSTATUS:31041} Mammogram : {UTDSTATUS:31041} DXA scan : {UTDSTATUS:31041} Immunizations : {Immunizations:5306}  Review of Systems See HPI for relevant ROS.  Outpatient Medications Prior to Visit  Medication Sig Dispense Refill   albuterol (VENTOLIN HFA) 108 (90 Base) MCG/ACT inhaler Inhale 2 puffs into the lungs every 6 (six) hours as needed for wheezing or shortness of breath. 8 g 2   Iron Combinations (IRON COMPLEX PO) Take by mouth.     meclizine (ANTIVERT) 25 MG tablet Take 0.5 tablet (12.5 mg) to 1 tablet (25 mg) up to 3 times a day as needed for dizziness. 20 tablet 0   Multiple Vitamin (MULTIVITAMIN) tablet Take 1 tablet by mouth daily.     vitamin B-12 (CYANOCOBALAMIN) 500 MCG tablet Take 500 mcg by mouth daily.     No facility-administered medications prior to visit.     Patient Active Problem List   Diagnosis Date Noted   Iron deficiency anemia 10/02/2023   Cigarette nicotine dependence without complication 10/02/2023   Anxiety 03/02/2022   Panic disorder 11/17/2020   Psychogenic nonepileptic seizure 11/17/2020    Objective:   There were no vitals filed for this visit.  There is no height or weight on file to calculate BMI.  Physical Exam ***   Assessment and  Plan:   Annual physical exam     This plan was discussed with the patient and questions were answered. There were no further concerns.  Follow up as indicated, or sooner should any new problems arise, if conditions worsen, or if they are otherwise concerned.   See patient instructions for additional information.  Aleksandra Raben Howell Pringle, MD  Family Medicine      Future Appointments  Date Time Provider Department Center  10/21/2023  9:20 AM [name], MD [facility]

## 2023-10-30 ENCOUNTER — Encounter: Payer: Medicaid Other | Admitting: Pediatrics

## 2023-11-28 ENCOUNTER — Encounter: Payer: Medicaid Other | Admitting: Pediatrics

## 2023-12-16 ENCOUNTER — Other Ambulatory Visit: Payer: Medicaid Other

## 2023-12-16 ENCOUNTER — Encounter: Payer: Medicaid Other | Admitting: Pediatrics

## 2023-12-16 DIAGNOSIS — D509 Iron deficiency anemia, unspecified: Secondary | ICD-10-CM

## 2023-12-17 LAB — CBC
Hematocrit: 44.8 % (ref 34.0–46.6)
Hemoglobin: 15.2 g/dL (ref 11.1–15.9)
MCH: 34.9 pg — ABNORMAL HIGH (ref 26.6–33.0)
MCHC: 33.9 g/dL (ref 31.5–35.7)
MCV: 103 fL — ABNORMAL HIGH (ref 79–97)
Platelets: 192 10*3/uL (ref 150–450)
RBC: 4.36 x10E6/uL (ref 3.77–5.28)
RDW: 11.7 % (ref 11.7–15.4)
WBC: 7.7 10*3/uL (ref 3.4–10.8)

## 2023-12-17 LAB — COMPREHENSIVE METABOLIC PANEL
ALT: 17 [IU]/L (ref 0–32)
AST: 21 [IU]/L (ref 0–40)
Albumin: 4.5 g/dL (ref 3.9–4.9)
Alkaline Phosphatase: 76 [IU]/L (ref 44–121)
BUN/Creatinine Ratio: 9 (ref 9–23)
BUN: 7 mg/dL (ref 6–20)
Bilirubin Total: 0.6 mg/dL (ref 0.0–1.2)
CO2: 22 mmol/L (ref 20–29)
Calcium: 9.3 mg/dL (ref 8.7–10.2)
Chloride: 102 mmol/L (ref 96–106)
Creatinine, Ser: 0.77 mg/dL (ref 0.57–1.00)
Globulin, Total: 2.4 g/dL (ref 1.5–4.5)
Glucose: 96 mg/dL (ref 70–99)
Potassium: 3.8 mmol/L (ref 3.5–5.2)
Sodium: 141 mmol/L (ref 134–144)
Total Protein: 6.9 g/dL (ref 6.0–8.5)
eGFR: 104 mL/min/{1.73_m2} (ref >59)

## 2023-12-17 LAB — LIPID PANEL
Chol/HDL Ratio: 2.9 {ratio} (ref 0.0–4.4)
Cholesterol, Total: 156 mg/dL (ref 100–199)
HDL: 53 mg/dL (ref >39)
LDL Chol Calc (NIH): 93 mg/dL (ref 0–99)
Triglycerides: 49 mg/dL (ref 0–149)
VLDL Cholesterol Cal: 10 mg/dL (ref 5–40)

## 2023-12-17 LAB — HEMOGLOBIN A1C
Est. average glucose Bld gHb Est-mCnc: 94 mg/dL
Hgb A1c MFr Bld: 4.9 % (ref 4.8–5.6)

## 2023-12-19 ENCOUNTER — Telehealth: Payer: Self-pay | Admitting: Pediatrics

## 2023-12-19 NOTE — Telephone Encounter (Signed)
Pt is calling to request if lab results can be resulted. Pt is concerned and has not been able to sleep or eat. And has been googling with the abnormal values mean. Please advise

## 2023-12-19 NOTE — Telephone Encounter (Signed)
Patient has seen mychart test result notes. No further needs.

## 2023-12-26 ENCOUNTER — Ambulatory Visit: Payer: Medicaid Other | Admitting: Pediatrics

## 2023-12-26 ENCOUNTER — Encounter: Payer: Self-pay | Admitting: Pediatrics

## 2023-12-26 VITALS — BP 100/61 | HR 69 | Temp 98.6°F | Resp 15 | Ht 64.29 in | Wt 99.0 lb

## 2023-12-26 DIAGNOSIS — Z0000 Encounter for general adult medical examination without abnormal findings: Secondary | ICD-10-CM | POA: Diagnosis not present

## 2023-12-26 DIAGNOSIS — Z1330 Encounter for screening examination for mental health and behavioral disorders, unspecified: Secondary | ICD-10-CM | POA: Diagnosis not present

## 2023-12-26 DIAGNOSIS — D7589 Other specified diseases of blood and blood-forming organs: Secondary | ICD-10-CM

## 2023-12-26 DIAGNOSIS — G4700 Insomnia, unspecified: Secondary | ICD-10-CM

## 2023-12-26 DIAGNOSIS — Z124 Encounter for screening for malignant neoplasm of cervix: Secondary | ICD-10-CM

## 2023-12-26 MED ORDER — TRAZODONE HCL 50 MG PO TABS
25.0000 mg | ORAL_TABLET | Freq: Every evening | ORAL | 3 refills | Status: DC | PRN
Start: 2023-12-26 — End: 2024-04-07

## 2023-12-26 NOTE — Progress Notes (Signed)
 BP 100/61 (BP Location: Right Arm, Patient Position: Sitting, Cuff Size: Small)   Pulse 69   Temp 98.6 F (37 C) (Oral)   Resp 15   Ht 5' 4.29 (1.633 m)   Wt 99 lb (44.9 kg)   LMP 11/26/2023 (Approximate)   SpO2 98%   BMI 16.84 kg/m    Annual Physical Exam - Female  Subjective:   CC: Annual Exam (Wants to wait on PAP. Blood work review)   Melanie [name] is a 33 y.o. female patient here for a preventative health maintenance exam. Additional topics discussed include:  #insomnia Ongoing insomnia Open to trying medication  Weight trend below: Wt Readings from Last 3 Encounters:  12/26/23 99 lb (44.9 kg)  10/02/23 95 lb 3.2 oz (43.2 kg)  03/02/22 90 lb (40.8 kg)   Health Habits: DIET: in general, a healthy diet   EXERCISE: has started to exercise some, limited by coughing/asthma type symptoms  DENTAL EXAM: Due EYE EXAM: Due                       Relevant Gynecologic History LMP: Patient's last menstrual period was 11/26/2023 (approximate).  Menstrual Status: premenopausal, Flow regular every month without intermenstrual spotting PAP History:  No Cervical Cancer Screening results to display.  History abnormal PAP: No  Sexual activity: single partner, hsuband Family history breast, ovarian cancer: No Domestic Violence Screen, feels safe at home: Yes  family history is not on file.  Social History   Tobacco Use   Smoking status: Every Day    Current packs/day: 0.50    Types: Cigarettes   Smokeless tobacco: Never  Vaping Use   Vaping status: Never Used  Substance Use Topics   Alcohol use: No   Drug use: No   Social History   Social History Narrative   Not on file    Social drivers questionnaire is reviewed and is positive for : none  Depression Screening:     12/26/2023    1:10 PM 10/02/2023   10:18 AM  Depression screen PHQ 2/9  Decreased Interest 1 2  Down, Depressed, Hopeless  1  PHQ - 2 Score 1 3  Altered sleeping 1 0  Tired, decreased  energy 1 3  Change in appetite 1 1  Feeling bad or failure about yourself  1 1  Trouble concentrating 1 0  Moving slowly or fidgety/restless 1 0  Suicidal thoughts 1 0  PHQ-9 Score 8 8  Difficult doing work/chores Somewhat difficult        12/26/2023    1:11 PM 10/02/2023   10:19 AM  GAD 7 : Generalized Anxiety Score  Nervous, Anxious, on Edge 1 2  Control/stop worrying 1 2  Worry too much - different things 1 3  Trouble relaxing 1 1  Restless 1 1  Easily annoyed or irritable 1 3  Afraid - awful might happen 1 1  Total GAD 7 Score 7 13  Anxiety Difficulty Somewhat difficult     Mental Health Plan: trazadone, CTM  Self Management Goals  Goals   None     Health Maintenance Colon Cancer Screening : Not applicable Mammogram : Not applicable DXA scan : Not applicable Immunizations : declined  Review of Systems See HPI for relevant ROS.  Outpatient Medications Prior to Visit  Medication Sig Dispense Refill   albuterol  (VENTOLIN  HFA) 108 (90 Base) MCG/ACT inhaler Inhale 2 puffs into the lungs every 6 (six) hours as needed for wheezing  or shortness of breath. 8 g 2   Iron Combinations (IRON COMPLEX PO) Take by mouth.     meclizine  (ANTIVERT ) 25 MG tablet Take 0.5 tablet (12.5 mg) to 1 tablet (25 mg) up to 3 times a day as needed for dizziness. 20 tablet 0   Multiple Vitamin (MULTIVITAMIN) tablet Take 1 tablet by mouth daily.     vitamin B-12 (CYANOCOBALAMIN) 500 MCG tablet Take 500 mcg by mouth daily.     No facility-administered medications prior to visit.     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  Physical Exam Constitutional:      Appearance: Normal appearance.  HENT:     Head: Normocephalic and atraumatic.  Eyes:     Pupils: Pupils are equal, round, and reactive to light.  Cardiovascular:     Rate and Rhythm: Normal rate and regular rhythm.     Pulses: Normal pulses.     Heart sounds: Normal heart sounds.  Pulmonary:     Effort: Pulmonary effort is normal.     Breath sounds: Normal breath sounds.  Abdominal:     General: Abdomen is flat.     Palpations: Abdomen is soft.  Musculoskeletal:        General: Normal range of motion.     Cervical back: Normal range of motion.  Skin:    General: Skin is warm and dry.     Capillary Refill: Capillary refill takes less than 2 seconds.  Neurological:     General: No focal deficit present.     Mental Status: She is alert. Mental status is at baseline.  Psychiatric:        Mood and Affect: Mood normal.        Behavior: Behavior normal.      Assessment and Plan:   Annual physical exam Discussed lifestyle modifications and goals including plant based eating styles (such as: Mediterranean eating style), regular exercise (at least 150 min of moderate-intensity aerobic exercise per week, given AHA workout handout), get adequate sleep, and continue working with PCP towards meeting health goals to ensure healthy aging.   Insomnia, unspecified type Prior treatment for panic attacks. Plan on trial this for sleep.  Med trails: Lexapro to 30 mg daily Buspar 5 mg TID PRN for anxiety   Zoloft - States that it made her want to kill herself Ativan  - States that she felt great and her mood had changed Klonopin  - made her feel worse Gabapentin - Didn't like taking it 3 times a day, made her dizzy Mirtazapine - didn't work for her Hydroxyzine - Took dad's and it didn't work for her  -     traZODone  HCl; Take 0.5-1 tablets  (25-50 mg total) by mouth at bedtime as needed for sleep.  Dispense: 30 tablet; Refill: 3  Encounter for behavioral health screening As part of their intake evaluation, the patient was screened for depression, anxiety.  PHQ9 SCORE 8, GAD7 SCORE 7.  Screening results positive for tested conditions. See plan under problem/diagnosis above.  Screening for cervical cancer Declines.  Macrocytosis Future order placed to further w/u macrocytosis. -     CBC with Differential/Platelet; Future -     Vitamin B12; Future -     Folate; Future -     Thyroid  Panel With TSH; Future  This plan was discussed with the patient and questions were answered. There were no further concerns.  Follow up as indicated, or sooner should any new problems arise, if conditions worsen, or if they are otherwise concerned.   See patient instructions for additional information.  Samarth Ogle SHAUNNA NETT, MD  Family Medicine      Future Appointments  Date Time Provider Department Center  01/14/2024  1:00 PM Nett Hadassah SHAUNNA, MD [facility]

## 2023-12-26 NOTE — Patient Instructions (Addendum)
 Trazadone 25-50mg    Get blood work when you can

## 2024-01-14 ENCOUNTER — Ambulatory Visit: Payer: Medicaid Other | Admitting: Pediatrics

## 2024-01-22 ENCOUNTER — Telehealth (INDEPENDENT_AMBULATORY_CARE_PROVIDER_SITE_OTHER): Admitting: Pediatrics

## 2024-01-22 ENCOUNTER — Encounter: Payer: Self-pay | Admitting: Pediatrics

## 2024-01-22 DIAGNOSIS — M545 Low back pain, unspecified: Secondary | ICD-10-CM

## 2024-01-22 MED ORDER — CYCLOBENZAPRINE HCL 10 MG PO TABS: 5.0000 mg | ORAL_TABLET | Freq: Three times a day (TID) | ORAL | 0 refills | Status: AC | PRN

## 2024-01-22 MED ORDER — CELECOXIB 100 MG PO CAPS: 100.0000 mg | ORAL_CAPSULE | Freq: Two times a day (BID) | ORAL | 0 refills | Status: AC

## 2024-01-22 NOTE — Patient Instructions (Addendum)
 Lease go get an xray of your back at the below address: [facility] [address] [place],  [place]  [address]  In the meantime, I sent below for managing the pain.  Celebrex 100mg  twice daily - 7-10days Flexeril as a muscle relaxant - can use  If your pain worsens and you develop new symptoms, please do not hesitate to seek medical attention.

## 2024-01-22 NOTE — Progress Notes (Signed)
 Telehealth Visit  I connected with  Melanie [name] on 02/01/24 by a video enabled telemedicine application and verified that I am speaking with the correct person using two identifiers.   I discussed the limitations of evaluation and management by telemedicine. The patient expressed understanding and agreed to proceed.  Subjective:    Patient ID: Melanie [name], female    DOB: 1990-12-27, 33 y.o.   MRN: [id number]  HPI: Melanie [name] is a 33 y.o. female  Chief Complaint  Patient presents with   Back Pain    Patient states she has been having low back pain since last week, describes the pains as shooting. States her muscles feel really tight and she feels pain with any movement. States she has been alternating tylenol and ibuprofen but the pains seem to be getting worse.     Discussed the use of AI scribe software for clinical note transcription with the patient, who gave verbal consent to proceed.  History of Present Illness   Melanie [name] is a 33 year old female who presents with low back pain.  She has been experiencing low back pain for the past week, described as shooting pain across the lower back. The pain is severe enough to prevent her from standing up straight and is relieved slightly when hunched over. She has been using Tylenol, ibuprofen, and pain patches without relief.  The pain radiates down her leg and up her back to her neck. She recalls moving a table last week, which may have triggered the pain, although she frequently moves items and has never had back problems before. The pain is severe enough to wake her from sleep if she turns the wrong way.  She describes muscle tightness throughout her body and has been using a heating pad, which provides some relief but does not resolve the pain.  She has noticed a decrease in urination frequency. No other urinary or bowel symptoms are present.  Current medications include Celebrex, taken twice daily, and Flexeril, a muscle relaxant,  which she can take up to three times daily as needed.      Relevant past medical, surgical, family and social history reviewed and updated as indicated. Interim medical history since our last visit reviewed. Allergies and medications reviewed and updated.  ROS per HPI unless specifically indicated above     Objective:    LMP 11/26/2023 (Approximate)   Wt Readings from Last 3 Encounters:  12/26/23 99 lb (44.9 kg)  10/02/23 95 lb 3.2 oz (43.2 kg)  03/02/22 90 lb (40.8 kg)     Physical Exam Constitutional:      General: She is not in acute distress.    Appearance: Normal appearance.  Neurological:     General: No focal deficit present.     Mental Status: She is alert. Mental status is at baseline.      LIMITED EXAM GIVEN VIDEO VISIT     Assessment & Plan:  Assessment & Plan   Acute low back pain, unspecified back pain laterality, unspecified whether sciatica present Acute low back pain with radicular symptoms. Differential includes muscle strain, disc displacement, or nerve impingement. Concern for disc displacement due to pain relief with forward flexion. - Order lumbar spine X-ray to assess for disc displacement or structural abnormalities. - Prescribe Celebrex twice daily for 7-10 days for pain management. - Prescribe Flexeril up to three times daily as needed for muscle relaxation; advise initial dose at bedtime to assess drowsiness. - Discuss potential need for MRI  if symptoms persist or worsen, especially with radicular symptoms. - Follow up after X-ray results to discuss findings and management.  -     DG Lumbar Spine Complete; Future -     Cyclobenzaprine HCl; Take 0.5-1 tablets (5-10 mg total) by mouth 3 (three) times daily as needed for up to 14 days for muscle spasms.  Dispense: 30 tablet; Refill: 0 -     Celecoxib; Take 1 capsule (100 mg total) by mouth 2 (two) times daily for 14 days.  Dispense: 28 capsule; Refill: 0    Follow up plan: Return if symptoms  worsen or fail to improve.  [name], MD   This visit was completed via video visit through MyChart due to the restrictions of the COVID-19 pandemic. All issues as above were discussed and addressed. Physical exam was done as above through visual confirmation on video through MyChart. If it was felt that the patient should be evaluated in the office, they were directed there. The patient verbally consented to this visit. Location of the patient: home Location of the provider: work Those involved with this call:  Provider: Modena Nunnery, MD CMA: Wilhemena Durie, CMA Time spent on call:  15 minutes with patient face to face via video conference. More than 50% of this time was spent in counseling and coordination of care. 15 minutes total spent in review of patient's record and preparation of their chart. Total time spent on this encounter: 30 minutes.

## 2024-02-01 ENCOUNTER — Encounter: Payer: Self-pay | Admitting: Pediatrics

## 2024-02-09 ENCOUNTER — Ambulatory Visit: Payer: Self-pay | Admitting: Pediatrics

## 2024-02-09 ENCOUNTER — Telehealth (INDEPENDENT_AMBULATORY_CARE_PROVIDER_SITE_OTHER): Admitting: Family Medicine

## 2024-02-09 ENCOUNTER — Encounter: Payer: Self-pay | Admitting: Family Medicine

## 2024-02-09 DIAGNOSIS — R42 Dizziness and giddiness: Secondary | ICD-10-CM | POA: Diagnosis not present

## 2024-02-09 MED ORDER — MECLIZINE HCL 25 MG PO TABS: 25.0000 mg | ORAL_TABLET | Freq: Three times a day (TID) | ORAL | 0 refills | Status: DC | PRN

## 2024-02-09 NOTE — Progress Notes (Signed)
 LMP 02/02/2024 (Approximate)    Subjective:    Patient ID: Melanie [name], female    DOB: 1991/04/02, 33 y.o.   MRN: [id number]  HPI: Melanie [name] is a 33 y.o. female  Chief Complaint  Patient presents with   Dizziness    Pt states she noticed the dizziness yesterday    DIZZINESS- this is recurrent, last one lasted about 5 months ago Duration: 1 day Description of symptoms: room spinning Duration of episode: constant Provoking factors: moving her head Triggered by rolling over in bed: yes Triggered by bending over: yes Aggravated by head movement: yes Aggravated by exertion, coughing, loud noises: no Recent head injury: no Recent or current viral symptoms: no History of vasovagal episodes: no Nausea: no Vomiting: no Tinnitus: yes Hearing loss: no Aural fullness: yes Headache: yes Photophobia/phonophobia: no Unsteady gait: yes- feels like she leans to one side and is unsteady- happens every time she has vertigo Postural instability: yes Diplopia, dysarthria, dysphagia or weakness: no Related to exertion: no Pallor: no Diaphoresis: no Dyspnea: yes- chronic, no changed Chest pain: no  Relevant past medical, surgical, family and social history reviewed and updated as indicated. Interim medical history since our last visit reviewed. Allergies and medications reviewed and updated.  Review of Systems  Constitutional: Negative.   Respiratory: Negative.    Cardiovascular: Negative.   Neurological:  Positive for dizziness, light-headedness and headaches. Negative for tremors, seizures, syncope, facial asymmetry, speech difficulty, weakness and numbness.  Psychiatric/Behavioral: Negative.      Per HPI unless specifically indicated above     Objective:    LMP 02/02/2024 (Approximate)   Wt Readings from Last 3 Encounters:  12/26/23 99 lb (44.9 kg)  10/02/23 95 lb 3.2 oz (43.2 kg)  03/02/22 90 lb (40.8 kg)    Physical Exam Vitals and nursing note reviewed.   Constitutional:      General: She is not in acute distress.    Appearance: Normal appearance. She is not ill-appearing, toxic-appearing or diaphoretic.  HENT:     Head: Normocephalic and atraumatic.     Right Ear: External ear normal.     Left Ear: External ear normal.     Nose: Nose normal.     Mouth/Throat:     Mouth: Mucous membranes are moist.     Pharynx: Oropharynx is clear.  Eyes:     General: No scleral icterus.       Right eye: No discharge.        Left eye: No discharge.     Extraocular Movements:     Right eye: No nystagmus.     Left eye: No nystagmus.     Conjunctiva/sclera: Conjunctivae normal.     Pupils: Pupils are equal, round, and reactive to light.  Pulmonary:     Effort: Pulmonary effort is normal. No respiratory distress.     Comments: Speaking in full sentences Musculoskeletal:        General: Normal range of motion.     Cervical back: Normal range of motion.  Skin:    Coloration: Skin is not jaundiced or pale.     Findings: No bruising, erythema, lesion or rash.  Neurological:     Mental Status: She is alert and oriented to person, place, and time. Mental status is at baseline.  Psychiatric:        Mood and Affect: Mood normal.        Behavior: Behavior normal.        Thought Content: Thought content  normal.        Judgment: Judgment normal.     Results for orders placed or performed in visit on 12/16/23  Comp Met (CMET)   Collection Time: 12/16/23  1:38 PM  Result Value Ref Range   Glucose 96 70 - 99 mg/dL   BUN 7 6 - 20 mg/dL   Creatinine, Ser 1.61 0.57 - 1.00 mg/dL   eGFR 096 >04 VW/UJW/1.19   BUN/Creatinine Ratio 9 9 - 23   Sodium 141 134 - 144 mmol/L   Potassium 3.8 3.5 - 5.2 mmol/L   Chloride 102 96 - 106 mmol/L   CO2 22 20 - 29 mmol/L   Calcium 9.3 8.7 - 10.2 mg/dL   Total Protein 6.9 6.0 - 8.5 g/dL   Albumin 4.5 3.9 - 4.9 g/dL   Globulin, Total 2.4 1.5 - 4.5 g/dL   Bilirubin Total 0.6 0.0 - 1.2 mg/dL   Alkaline Phosphatase 76 44  - 121 IU/L   AST 21 0 - 40 IU/L   ALT 17 0 - 32 IU/L  CBC   Collection Time: 12/16/23  1:38 PM  Result Value Ref Range   WBC 7.7 3.4 - 10.8 x10E3/uL   RBC 4.36 3.77 - 5.28 x10E6/uL   Hemoglobin 15.2 11.1 - 15.9 g/dL   Hematocrit 14.7 82.9 - 46.6 %   MCV 103 (H) 79 - 97 fL   MCH 34.9 (H) 26.6 - 33.0 pg   MCHC 33.9 31.5 - 35.7 g/dL   RDW 56.2 13.0 - 86.5 %   Platelets 192 150 - 450 x10E3/uL  HgB A1c   Collection Time: 12/16/23  1:38 PM  Result Value Ref Range   Hgb A1c MFr Bld 4.9 4.8 - 5.6 %   Est. average glucose Bld gHb Est-mCnc 94 mg/dL  Lipid Profile   Collection Time: 12/16/23  1:38 PM  Result Value Ref Range   Cholesterol, Total 156 100 - 199 mg/dL   Triglycerides 49 0 - 149 mg/dL   HDL 53 >78 mg/dL   VLDL Cholesterol Cal 10 5 - 40 mg/dL   LDL Chol Calc (NIH) 93 0 - 99 mg/dL   Chol/HDL Ratio 2.9 0.0 - 4.4 ratio      Assessment & Plan:   Problem List Items Addressed This Visit       Other   Vertigo - Primary   Recurrent. Unable to fully examine due to video visit. No sign of nystagmus on exam. No sign of localizing symptoms, but her favoring one side is concerning. Will get her started on Epley's manuvers and get her into PT ASAP given recurrance. Follow up with PCP in 1-2 weeks. Call if getting worse. Out of work this week.       Relevant Orders   Ambulatory referral to Physical Therapy     Follow up plan: Return 1-2 weeks, for with Dr. Evelene Croon.   This visit was completed via video visit through MyChart due to the restrictions of the COVID-19 pandemic. All issues as above were discussed and addressed. Physical exam was done as above through visual confirmation on video through MyChart. If it was felt that the patient should be evaluated in the office, they were directed there. The patient verbally consented to this visit. Location of the patient: home Location of the provider: work Those involved with this call:  Provider: Olevia Perches, DO CMA: Maggie Font, CMA Front Desk/Registration: Servando Snare  Time spent on call:  15 minutes with patient face to face  via video conference. More than 50% of this time was spent in counseling and coordination of care. 23 minutes total spent in review of patient's record and preparation of their chart.

## 2024-02-09 NOTE — Telephone Encounter (Signed)
 Chief Complaint: Vertigo Symptoms: Vertigo episodes, ear fullness in right ear Frequency: since yesterday Pertinent Negatives: Patient denies new numbness/weakness in arms or legs Disposition: [] ED /[] Urgent Care (no appt availability in office) / [x] Appointment(In office/virtual)/ []  [facility]/ [] Home Care/ [] Refused Recommended Disposition /[] [facility]/ []  Follow-up with PCP Additional Notes: Patient called to state that she has been experiencing vertigo episodes since yesterday that is worse with movement. Patient has been diagnosed with Vertigo in the past and has had an episode 5 months ago. Patient requesting appointment for today - but states she needs it to be virtual because she does not have an adult who can drive her and does not feel comfortable driving at this time. Advised patient that due to her ear discomfort, they may suggest she come in to office but virtual will be scheduled for now, per patient request.    Copied from CRM [id number]. Topic: Clinical - Red Word Triage >> Feb 09, 2024  9:51 AM Albin Felling L wrote: Red Word that prompted transfer to Nurse Triage: Vertigo worsening, Dizziness, Reason for Disposition  [1] MODERATE dizziness (e.g., vertigo; feels very unsteady, interferes with normal activities) AND [2] has been evaluated by doctor (or NP/PA) for this  Answer Assessment - Initial Assessment Questions 1. DESCRIPTION: "Describe your dizziness."     Feels like Vertigo - favoring left side 2. VERTIGO: "Do you feel like either you or the room is spinning or tilting?"      Feels like room is spinning 3. LIGHTHEADED: "Do you feel lightheaded?" (e.g., somewhat faint, woozy, weak upon standing)     No 4. SEVERITY: "How bad is it?"  "Can you walk?"   - MILD: Feels slightly dizzy and unsteady, but is walking normally.   - MODERATE: Feels unsteady when walking, but not falling; interferes with normal activities (e.g., school, work).   - SEVERE: Unable to  walk without falling, or requires assistance to walk without falling.     Moderate  5. ONSET:  "When did the dizziness begin?"     Yesterday  6. AGGRAVATING FACTORS: "Does anything make it worse?" (e.g., standing, change in head position)     Movement 7. CAUSE: "What do you think is causing the dizziness?"     Vertigo  8. RECURRENT SYMPTOM: "Have you had dizziness before?" If Yes, ask: "When was the last time?" "What happened that time?"     5 months ago 9. OTHER SYMPTOMS: "Do you have any other symptoms?" (e.g., headache, weakness, numbness, vomiting, earache)     Right ear feels congestion, runny nose 10. PREGNANCY: "Is there any chance you are pregnant?" "When was your last menstrual period?"       No  Protocols used: Dizziness - Vertigo-A-AH

## 2024-02-09 NOTE — Assessment & Plan Note (Signed)
 Recurrent. Unable to fully examine due to video visit. No sign of nystagmus on exam. No sign of localizing symptoms, but her favoring one side is concerning. Will get her started on Epley's manuvers and get her into PT ASAP given recurrance. Follow up with PCP in 1-2 weeks. Call if getting worse. Out of work this week.

## 2024-02-10 ENCOUNTER — Encounter: Payer: Self-pay | Admitting: Family Medicine

## 2024-02-12 NOTE — Progress Notes (Signed)
 Patient had appt 02-09-24

## 2024-02-16 NOTE — Telephone Encounter (Signed)
Appt with PCP

## 2024-02-17 NOTE — Telephone Encounter (Signed)
 Appointment has been made

## 2024-02-19 ENCOUNTER — Ambulatory Visit: Admitting: Nurse Practitioner
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
## 2024-03-30 ENCOUNTER — Ambulatory Visit

## 2024-03-30 DIAGNOSIS — R42 Dizziness and giddiness: Secondary | ICD-10-CM | POA: Insufficient documentation

## 2024-03-30 NOTE — Therapy (Incomplete)
 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   PCP: Hadassah Letters, MD  REFERRING PROVIDER: Hadassah Letters, MD   REFERRING DIAG: ***  RATIONALE FOR EVALUATION AND TREATMENT: {HABREHAB:27488}  THERAPY DIAG: Dizziness and giddiness  ONSET DATE: ***  FOLLOW-UP APPT SCHEDULED WITH REFERRING PROVIDER: {yes/no:20286}    SUBJECTIVE:   Chief Complaint:  ***  Pertinent History DIZZINESS- this is recurrent, last one lasted about 5 months ago Duration: 1 day Description of symptoms: room spinning Duration of episode: constant Provoking factors: moving her head Triggered by rolling over in bed: yes Triggered by bending over: yes Aggravated by head movement: yes Aggravated by exertion, coughing, loud noises: no Recent head injury: no Recent or current viral symptoms: no History of vasovagal episodes: no Nausea: no Vomiting: no Tinnitus: yes Hearing loss: no Aural fullness: yes Headache: yes Photophobia/phonophobia: no Unsteady gait: yes- feels like she leans to one side and is unsteady- happens every time she has vertigo Postural instability: yes Diplopia, dysarthria, dysphagia or weakness: no Related to exertion: no Pallor: no Diaphoresis: no Dyspnea: yes- chronic, no changed Chest pain: no   Relevant past medical, surgical, family and social history reviewed and updated as indicated. Interim medical history  since our last visit reviewed. Allergies and medications reviewed and updated.   Review of Systems  Constitutional: Negative.   Respiratory: Negative.    Cardiovascular: Negative.   Neurological:  Positive for dizziness, light-headedness and headaches. Negative for tremors, seizures, syncope, facial asymmetry, speech difficulty, weakness and numbness.  Psychiatric/Behavioral: Negative.        Per HPI unless specifically indicated above  Description of dizziness: Frequency:  Duration: Symptom nature: {DizzyNature:27369} Progression of symptoms since onset: {DizzyProgress:27370} History of similar episodes: {yes/no:20286}  Provocative Factors: Easing Factors:  Auditory complaints (tinnitus, pain, drainage, hearing loss, aural fullness): {yes/no:20286} Vision changes (diplopia, visual field loss, recent changes, recent eye exam): {yes/no:20286} Chest pain/palpitations: {yes/no:20286} History of head injury/concussion: {yes/no:20286} Stress/anxiety: {yes/no:20286} Migraines/headaches: {yes/no:20286} Nausea/vomiting: {yes/no:20286} Numbness/tingling: {yes/no:20286} Focal weakness: {yes/no:20286} Dysarthria/dysphagia/drop attacks: {yes/no:20286}  Has patient fallen in last 6 months? {yes/no:20286}, Number of falls: *** Pertinent pain: {yes/no:20286} Dominant hand: {RIGHT/LEFT:20294} Imaging: {yes/no:20286}  Prior level of function: {PLOF:24004} Occupational demands: Hobbies:  Red Flags: Negative for bowel/bladder changes, personal history of cancer, chills/fever, night sweats, unexplained weight loss/gain  PRECAUTIONS: {Therapy precautions:24002}  WEIGHT BEARING RESTRICTIONS {Yes ***/No:24003}  LIVING ENVIRONMENT: Lives with: {[facility] lives with:25569::"lives with their family"} Lives in: {Lives in:25570} Stairs: {yes/no:20286}; {Stairs:24000} Has following equipment at home: {Assistive devices:23999}  PATIENT GOALS ***   OBJECTIVE EXAMINATION  POSTURE: No gross  deficits contributing to symptoms  NEUROLOGICAL SCREEN: (2+ unless otherwise noted.) N=normal  Ab=abnormal  Level Dermatome R L Myotome R L Reflex R L  C3 Anterior Neck N N Sidebend C2-3 N N Jaw CN V    C4 Top of Shoulder N N Shoulder Shrug C4 N N Hoffman's UMN    C5 Lateral Upper Arm N N Shoulder ABD  C4-5 N N Biceps C5-6    C6 Lateral Arm/ Thumb N N Arm Flex/ Wrist Ext C5-6 N N Brachiorad. C5-6    C7 Middle Finger N N Arm Ext//Wrist Flex C6-7 N N Triceps C7    C8 4th & 5th Finger N N Flex/ Ext Carpi Ulnaris C8 N N Patellar (L3-4)    T1 Medial Arm N N Interossei T1 N N Gastrocnemius    L2 Medial thigh/groin N N Illiopsoas (L2-3) N N     L3 Lower thigh/med.knee N N Quadriceps (L3-4) N N     L4 Medial leg/lat thigh N N Tibialis Ant (L4-5) N N     L5 Lat. leg & dorsal foot N N EHL (L5) N N     S1 post/lat foot/thigh/leg N N Gastrocnemius (S1-2) N N     S2 Post./med. thigh & leg N N Hamstrings (L4-S3) N N      CRANIAL NERVES II, III, IV, VI: Pupils equal and reactive to light, visual acuity and visual fields are intact, extraocular muscles are intact  V: Facial sensation is intact and symmetric bilaterally  VII: Facial strength is intact and symmetric bilaterally  VIII: Hearing is normal as tested by gross conversation IX, X: Palate elevates midline, normal phonation, uvula midline XI: Shoulder shrug strength is intact  XII: Tongue protrudes midline   COORDINATION Finger to Nose: Normal Heel to Shin: Normal Pronator Drift: Negative Rapid Alternating Movements: Normal Finger to Thumb Opposition: Normal   RANGE OF MOTION Cervical Spine AROM WFL and painless in all planes. No functional focal deficits in AROM noted in BUE/BLE  MANUAL MUSCLE TESTING BUE/BLE strength WNL without focal deficits  TRANSFERS/GAIT Independent for transfers and ambulation without assistive device   PATIENT SURVEYS FOTO: ,predicted improvement to  ABC: % DHI: /100  OCULOMOTOR / VESTIBULAR  TESTING  Oculomotor Exam- Room Light  Findings Comments  Ocular Alignment {normal/abnormal/not examined:14677}   Ocular ROM {normal/abnormal/not examined:14677}   Spontaneous Nystagmus {normal/abnormal/not examined:14677}   Gaze-Holding Nystagmus {normal/abnormal/not examined:14677}   End-Gaze Nystagmus {normal/abnormal/not examined:14677}   Vergence (normal 2-3") {normal/abnormal/not examined:14677}   Smooth Pursuit {normal/abnormal/not examined:14677}   Cross-Cover Test {normal/abnormal/not examined:14677}   Saccades {normal/abnormal/not examined:14677}   VOR Cancellation {normal/abnormal/not examined:14677}   Left Head Impulse {normal/abnormal/not examined:14677}   Right Head Impulse {normal/abnormal/not examined:14677}   Static Acuity {normal/abnormal/not examined:14677}   Dynamic Acuity {normal/abnormal/not examined:14677}    Oculomotor Exam- Fixation Suppressed  Findings Comments  Ocular Alignment {normal/abnormal/not examined:14677}   Spontaneous Nystagmus {normal/abnormal/not examined:14677}   Gaze-Holding Nystagmus {normal/abnormal/not examined:14677}   End-Gaze Nystagmus {normal/abnormal/not examined:14677}   Head Shaking Nystagmus {normal/abnormal/not examined:14677}   Pressure-Induced Nystagmus {normal/abnormal/not examined:14677}   Hyperventilation Induced Nystagmus {normal/abnormal/not examined:14677}   Skull Vibration Induced Nystagmus {normal/abnormal/not examined:14677}    BPPV TESTS:  Symptoms Duration Intensity Nystagmus  L Dix-Hallpike None   None  R Dix-Hallpike None   None  L Head Roll None   None  R Head Roll None   None  L Sidelying Test      R Sidelying Test      (blank = not tested)  Clinical Test of Sensory Interaction for Balance (CTSIB): CONDITION TIME SWAY  Eyes open, firm surface 30 seconds   Eyes closed, firm surface 30 seconds   Eyes open, foam surface 30 seconds   Eyes closed, foam surface 30 seconds     FUNCTIONAL OUTCOME MEASURES   Results Comments  BERG    DGI    FGA    TUG  5TSTS    6 Minute Walk Test    10 Meter Gait Speed    (blank = not tested)   TODAY'S TREATMENT  Deferred   PATIENT EDUCATION:  Education details: Plan of care Person educated: Patient Education method: Explanation Education comprehension: verbalized understanding   HOME EXERCISE PROGRAM:  None currently   ASSESSMENT: CLINICAL IMPRESSION: Patient is a *** y.o. *** who was seen today for physical therapy evaluation and treatment for ***.   OBJECTIVE IMPAIRMENTS: {opptimpairments:25111}.   ACTIVITY LIMITATIONS: {activitylimitations:27494}  PARTICIPATION LIMITATIONS: {participationrestrictions:25113}  PERSONAL FACTORS: {Personal factors:25162} are also affecting patient's functional outcome.   REHAB POTENTIAL: {rehabpotential:25112}  CLINICAL DECISION MAKING: {clinical decision making:25114}  EVALUATION COMPLEXITY: {Evaluation complexity:25115}   GOALS:  SHORT TERM GOALS: Target date: {follow up:25551}  Pt will be independent with HEP for dizziness in order to decrease symptoms, improve balance,decrease fall risk, and improve function at home. Baseline: Goal status: INITIAL   LONG TERM GOALS: Target date: {follow up:25551}  Pt will increase FOTO to at least *** to demonstrate significant improvement in function at home related to dizziness.  Baseline:  Goal status: {GOALSTATUS:25110}  2.  Pt will decrease DHI score by at least 18 points in order to demonstrate clinically significant reduction in disability related to dizziness.  Baseline: *** Goal status: {GOALSTATUS:25110}  3.  Pt will improve ABC by at least 13% in order to demonstrate clinically significant improvement in balance confidence.      Baseline: *** Goal status: {GOALSTATUS:25110}  4. Pt will decrease 5TSTS by at least 3 seconds in order to demonstrate clinically significant improvement in LE strength      Baseline: *** Goal status:  {GOALSTATUS:25110}  5. Pt will improve DGI by at least 3 points in order to demonstrate clinically significant improvement in balance and decreased risk for falls.     Baseline: *** Goal status: {GOALSTATUS:25110}  6. Pt will decrease TUG to below 14 seconds/decrease in order to demonstrate decreased fall risk.  Baseline: *** Goal status: {GOALSTATUS:25110}  7. Pt will increase by at least 20m (150ft) in order to demonstrate clinically significant improvement in cardiopulmonary endurance and community ambulation   Baseline: *** Goal status: {GOALSTATUS:25110}   PLAN: PT FREQUENCY: 1x/week  PT DURATION: 8 weeks  PLANNED INTERVENTIONS: Therapeutic exercises, Therapeutic activity, Neuromuscular re-education, Balance training, Gait training, Patient/Family education, Self Care, Joint mobilization, Joint manipulation, Vestibular training, Canalith repositioning, Orthotic/Fit training, DME instructions, Dry Needling, Electrical stimulation, Spinal manipulation, Spinal mobilization, Cryotherapy, Moist heat, Taping, Traction, Ultrasound, Ionotophoresis 4mg /ml Dexamethasone, Manual therapy, and Re-evaluation.  PLAN FOR NEXT SESSION: ***   Sherill Ding Garnet Chatmon PT, DPT, GCS  Jaela Yepez, PT 03/30/2024, 7:57 AM

## 2024-04-01 ENCOUNTER — Ambulatory Visit: Admitting: Pediatrics

## 2024-04-01 ENCOUNTER — Ambulatory Visit (INDEPENDENT_AMBULATORY_CARE_PROVIDER_SITE_OTHER): Admitting: Pediatrics

## 2024-04-01 ENCOUNTER — Encounter: Payer: Self-pay | Admitting: Pediatrics

## 2024-04-01 VITALS — BP 105/69 | HR 106 | Temp 98.0°F | Wt 101.8 lb

## 2024-04-01 DIAGNOSIS — Z3490 Encounter for supervision of normal pregnancy, unspecified, unspecified trimester: Secondary | ICD-10-CM

## 2024-04-01 LAB — PREGNANCY, URINE: Preg Test, Ur: POSITIVE — AB

## 2024-04-01 MED ORDER — PRENATAL MULTIVITAMIN CH: 1.0000 | ORAL_TABLET | Freq: Every day | ORAL | 3 refills | Status: DC

## 2024-04-01 NOTE — Therapy (Incomplete)
 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   PCP: Hadassah Letters, MD  REFERRING PROVIDER: Solomon Dupre, DO   REFERRING DIAG: ***  RATIONALE FOR EVALUATION AND TREATMENT: {HABREHAB:27488}  THERAPY DIAG: Dizziness and giddiness  ONSET DATE: ***  FOLLOW-UP APPT SCHEDULED WITH REFERRING PROVIDER: {yes/no:20286}    SUBJECTIVE:   Chief Complaint:  ***  Pertinent History DIZZINESS- this is recurrent, last one lasted about 5 months ago Duration: 1 day Description of symptoms: room spinning Duration of episode: constant Provoking factors: moving her head Triggered by rolling over in bed: yes Triggered by bending over: yes Aggravated by head movement: yes Aggravated by exertion, coughing, loud noises: no Recent head injury: no Recent or current viral symptoms: no History of vasovagal episodes: no Nausea: no Vomiting: no Tinnitus: yes Hearing loss: no Aural fullness: yes Headache: yes Photophobia/phonophobia: no Unsteady gait: yes- feels like she leans to one side and is unsteady- happens every time she has vertigo Postural instability: yes Diplopia, dysarthria, dysphagia or weakness: no Related to exertion: no Pallor: no Diaphoresis: no Dyspnea: yes- chronic, no changed Chest pain: no   Relevant past medical, surgical, family and social history reviewed and updated as indicated. Interim medical history  since our last visit reviewed. Allergies and medications reviewed and updated.   Review of Systems  Constitutional: Negative.   Respiratory: Negative.    Cardiovascular: Negative.   Neurological:  Positive for dizziness, light-headedness and headaches. Negative for tremors, seizures, syncope, facial asymmetry, speech difficulty, weakness and numbness.  Psychiatric/Behavioral: Negative.        Per HPI unless specifically indicated above  Description of dizziness: Frequency:  Duration: Symptom nature: {DizzyNature:27369} Progression of symptoms since onset: {DizzyProgress:27370} History of similar episodes: {yes/no:20286}  Provocative Factors: Easing Factors:  Auditory complaints (tinnitus, pain, drainage, hearing loss, aural fullness): {yes/no:20286} Vision changes (diplopia, visual field loss, recent changes, recent eye exam): {yes/no:20286} Chest pain/palpitations: {yes/no:20286} History of head injury/concussion: {yes/no:20286} Stress/anxiety: {yes/no:20286} Migraines/headaches: {yes/no:20286} Nausea/vomiting: {yes/no:20286} Numbness/tingling: {yes/no:20286} Focal weakness: {yes/no:20286} Dysarthria/dysphagia/drop attacks: {yes/no:20286}  Has patient fallen in last 6 months? {yes/no:20286}, Number of falls: *** Pertinent pain: {yes/no:20286} Dominant hand: {RIGHT/LEFT:20294} Imaging: {yes/no:20286}  Prior level of function: {PLOF:24004} Occupational demands: Hobbies:  Red Flags: Negative for bowel/bladder changes, personal history of cancer, chills/fever, night sweats, unexplained weight loss/gain  PRECAUTIONS: {Therapy precautions:24002}  WEIGHT BEARING RESTRICTIONS {Yes ***/No:24003}  LIVING ENVIRONMENT: Lives with: {[facility] lives with:25569::"lives with their family"} Lives in: {Lives in:25570} Stairs: {yes/no:20286}; {Stairs:24000} Has following equipment at home: {Assistive devices:23999}  PATIENT GOALS ***   OBJECTIVE EXAMINATION  POSTURE: No gross  deficits contributing to symptoms  NEUROLOGICAL SCREEN: (2+ unless otherwise noted.) N=normal  Ab=abnormal  Level Dermatome R L Myotome R L Reflex R L  C3 Anterior Neck N N Sidebend C2-3 N N Jaw CN V    C4 Top of Shoulder N N Shoulder Shrug C4 N N Hoffman's UMN    C5 Lateral Upper Arm N N Shoulder ABD  C4-5 N N Biceps C5-6    C6 Lateral Arm/ Thumb N N Arm Flex/ Wrist Ext C5-6 N N Brachiorad. C5-6    C7 Middle Finger N N Arm Ext//Wrist Flex C6-7 N N Triceps C7    C8 4th & 5th Finger N N Flex/ Ext Carpi Ulnaris C8 N N Patellar (L3-4)    T1 Medial Arm N N Interossei T1 N N Gastrocnemius    L2 Medial thigh/groin N N Illiopsoas (L2-3) N N     L3 Lower thigh/med.knee N N Quadriceps (L3-4) N N     L4 Medial leg/lat thigh N N Tibialis Ant (L4-5) N N     L5 Lat. leg & dorsal foot N N EHL (L5) N N     S1 post/lat foot/thigh/leg N N Gastrocnemius (S1-2) N N     S2 Post./med. thigh & leg N N Hamstrings (L4-S3) N N      CRANIAL NERVES II, III, IV, VI: Pupils equal and reactive to light, visual acuity and visual fields are intact, extraocular muscles are intact  V: Facial sensation is intact and symmetric bilaterally  VII: Facial strength is intact and symmetric bilaterally  VIII: Hearing is normal as tested by gross conversation IX, X: Palate elevates midline, normal phonation, uvula midline XI: Shoulder shrug strength is intact  XII: Tongue protrudes midline   COORDINATION Finger to Nose: Normal Heel to Shin: Normal Pronator Drift: Negative Rapid Alternating Movements: Normal Finger to Thumb Opposition: Normal   RANGE OF MOTION Cervical Spine AROM WFL and painless in all planes. No functional focal deficits in AROM noted in BUE/BLE  MANUAL MUSCLE TESTING BUE/BLE strength WNL without focal deficits  TRANSFERS/GAIT Independent for transfers and ambulation without assistive device   PATIENT SURVEYS FOTO: ,predicted improvement to  ABC: % DHI: /100  OCULOMOTOR / VESTIBULAR  TESTING  Oculomotor Exam- Room Light  Findings Comments  Ocular Alignment {normal/abnormal/not examined:14677}   Ocular ROM {normal/abnormal/not examined:14677}   Spontaneous Nystagmus {normal/abnormal/not examined:14677}   Gaze-Holding Nystagmus {normal/abnormal/not examined:14677}   End-Gaze Nystagmus {normal/abnormal/not examined:14677}   Vergence (normal 2-3") {normal/abnormal/not examined:14677}   Smooth Pursuit {normal/abnormal/not examined:14677}   Cross-Cover Test {normal/abnormal/not examined:14677}   Saccades {normal/abnormal/not examined:14677}   VOR Cancellation {normal/abnormal/not examined:14677}   Left Head Impulse {normal/abnormal/not examined:14677}   Right Head Impulse {normal/abnormal/not examined:14677}   Static Acuity {normal/abnormal/not examined:14677}   Dynamic Acuity {normal/abnormal/not examined:14677}    Oculomotor Exam- Fixation Suppressed  Findings Comments  Ocular Alignment {normal/abnormal/not examined:14677}   Spontaneous Nystagmus {normal/abnormal/not examined:14677}   Gaze-Holding Nystagmus {normal/abnormal/not examined:14677}   End-Gaze Nystagmus {normal/abnormal/not examined:14677}   Head Shaking Nystagmus {normal/abnormal/not examined:14677}   Pressure-Induced Nystagmus {normal/abnormal/not examined:14677}   Hyperventilation Induced Nystagmus {normal/abnormal/not examined:14677}   Skull Vibration Induced Nystagmus {normal/abnormal/not examined:14677}    BPPV TESTS:  Symptoms Duration Intensity Nystagmus  L Dix-Hallpike None   None  R Dix-Hallpike None   None  L Head Roll None   None  R Head Roll None   None  L Sidelying Test      R Sidelying Test      (blank = not tested)  Clinical Test of Sensory Interaction for Balance (CTSIB): CONDITION TIME SWAY  Eyes open, firm surface 30 seconds   Eyes closed, firm surface 30 seconds   Eyes open, foam surface 30 seconds   Eyes closed, foam surface 30 seconds     FUNCTIONAL OUTCOME MEASURES   Results Comments  BERG    DGI    FGA    TUG  5TSTS    6 Minute Walk Test    10 Meter Gait Speed    (blank = not tested)   TODAY'S TREATMENT  Deferred   PATIENT EDUCATION:  Education details: Plan of care Person educated: Patient Education method: Explanation Education comprehension: verbalized understanding   HOME EXERCISE PROGRAM:  None currently   ASSESSMENT: CLINICAL IMPRESSION: Patient is a *** y.o. *** who was seen today for physical therapy evaluation and treatment for ***.   OBJECTIVE IMPAIRMENTS: {opptimpairments:25111}.   ACTIVITY LIMITATIONS: {activitylimitations:27494}  PARTICIPATION LIMITATIONS: {participationrestrictions:25113}  PERSONAL FACTORS: {Personal factors:25162} are also affecting patient's functional outcome.   REHAB POTENTIAL: {rehabpotential:25112}  CLINICAL DECISION MAKING: {clinical decision making:25114}  EVALUATION COMPLEXITY: {Evaluation complexity:25115}   GOALS:  SHORT TERM GOALS: Target date: {follow up:25551}  Pt will be independent with HEP for dizziness in order to decrease symptoms, improve balance,decrease fall risk, and improve function at home. Baseline: Goal status: INITIAL   LONG TERM GOALS: Target date: {follow up:25551}  Pt will increase FOTO to at least *** to demonstrate significant improvement in function at home related to dizziness.  Baseline:  Goal status: {GOALSTATUS:25110}  2.  Pt will decrease DHI score by at least 18 points in order to demonstrate clinically significant reduction in disability related to dizziness.  Baseline: *** Goal status: {GOALSTATUS:25110}  3.  Pt will improve ABC by at least 13% in order to demonstrate clinically significant improvement in balance confidence.      Baseline: *** Goal status: {GOALSTATUS:25110}  4. Pt will decrease 5TSTS by at least 3 seconds in order to demonstrate clinically significant improvement in LE strength      Baseline: *** Goal status:  {GOALSTATUS:25110}  5. Pt will improve DGI by at least 3 points in order to demonstrate clinically significant improvement in balance and decreased risk for falls.     Baseline: *** Goal status: {GOALSTATUS:25110}  6. Pt will decrease TUG to below 14 seconds/decrease in order to demonstrate decreased fall risk.  Baseline: *** Goal status: {GOALSTATUS:25110}  7. Pt will increase by at least 18m (169ft) in order to demonstrate clinically significant improvement in cardiopulmonary endurance and community ambulation   Baseline: *** Goal status: {GOALSTATUS:25110}   PLAN: PT FREQUENCY: 1x/week  PT DURATION: 8 weeks  PLANNED INTERVENTIONS: Therapeutic exercises, Therapeutic activity, Neuromuscular re-education, Balance training, Gait training, Patient/Family education, Self Care, Joint mobilization, Joint manipulation, Vestibular training, Canalith repositioning, Orthotic/Fit training, DME instructions, Dry Needling, Electrical stimulation, Spinal manipulation, Spinal mobilization, Cryotherapy, Moist heat, Taping, Traction, Ultrasound, Ionotophoresis 4mg /ml Dexamethasone, Manual therapy, and Re-evaluation.  PLAN FOR NEXT SESSION: ***   Sherill Ding Ravan Schlemmer PT, DPT, GCS  Srinika Delone, PT 04/01/2024, 5:09 PM

## 2024-04-01 NOTE — Patient Instructions (Signed)
 Start prenatal vitamins

## 2024-04-01 NOTE — Progress Notes (Signed)
 Office Visit  BP 105/69   Pulse (!) 106   Temp 98 F (36.7 C) (Oral)   Wt 101 lb 12.8 oz (46.2 kg)   LMP 02/22/2024   SpO2 99%   BMI 17.32 kg/m    Subjective:    Patient ID: Melanie [name], female    DOB: June 05, 1991, 33 y.o.   MRN: [id number]  HPI: Melanie [name] is a 33 y.o. female  Chief Complaint  Patient presents with   Possible Pregnancy    Discussed the use of AI scribe software for clinical note transcription with the patient, who gave verbal consent to proceed.  History of Present Illness   Melanie [name] is a 33 year old female who presents with a positive home pregnancy test and symptoms of fatigue and nausea.  Her last menstrual period was on April 6th, 2025, and she has not had a period since. She took a home pregnancy test which was positive. The pregnancy was unexpected, and she describes feeling 'shock' upon discovering it. She has been with her husband for seven years and has one other child, a twelve-year-old.  She experiences significant fatigue, describing it as feeling 'insanely tired lately' and 'burnt out.' She also has mild nausea, increased urination, and breast tenderness that began one to two weeks ago, initially attributed to an impending period.  She has a history of anxiety and describes herself as a 'hypochondriac.' She expresses concern about a previous hemorrhage during her first pregnancy and fears about her health during this pregnancy, particularly regarding potential complications during childbirth.  She has a history of smoking and using tanning beds but has stopped smoking since discovering the pregnancy. She is anxious about the potential effects of these activities on the pregnancy.        Relevant past medical, surgical, family and social history reviewed and updated as indicated. Interim medical history since our last visit reviewed. Allergies and medications reviewed and updated.  ROS per HPI unless specifically indicated above      Objective:     BP 105/69   Pulse (!) 106   Temp 98 F (36.7 C) (Oral)   Wt 101 lb 12.8 oz (46.2 kg)   LMP 02/22/2024   SpO2 99%   BMI 17.32 kg/m   Wt Readings from Last 3 Encounters:  04/01/24 101 lb 12.8 oz (46.2 kg)  12/26/23 99 lb (44.9 kg)  10/02/23 95 lb 3.2 oz (43.2 kg)     Physical Exam Constitutional:      Appearance: Normal appearance.  Pulmonary:     Effort: Pulmonary effort is normal.  Musculoskeletal:        General: Normal range of motion.  Skin:    Comments: Normal skin color  Neurological:     General: No focal deficit present.     Mental Status: She is alert. Mental status is at baseline.  Psychiatric:        Mood and Affect: Mood normal.        Behavior: Behavior normal.        Thought Content: Thought content normal.         04/01/2024    1:33 PM 02/09/2024    1:16 PM 12/26/2023    1:10 PM 10/02/2023   10:18 AM  Depression screen PHQ 2/9  Decreased Interest 3 0 1 2  Down, Depressed, Hopeless 2 0  1  PHQ - 2 Score 5 0 1 3  Altered sleeping 0  1 0  Tired, decreased energy 1  1 3  Change in appetite 0  1 1  Feeling bad or failure about yourself  0  1 1  Trouble concentrating 0  1 0  Moving slowly or fidgety/restless 0  1 0  Suicidal thoughts 0  1 0  PHQ-9 Score 6  8 8   Difficult doing work/chores Somewhat difficult  Somewhat difficult        04/01/2024    1:33 PM 02/09/2024    1:17 PM 12/26/2023    1:11 PM 10/02/2023   10:19 AM  GAD 7 : Generalized Anxiety Score  Nervous, Anxious, on Edge 1 0 1 2  Control/stop worrying 1 0 1 2  Worry too much - different things 1 0 1 3  Trouble relaxing 1 0 1 1  Restless 1 0 1 1  Easily annoyed or irritable 1 0 1 3  Afraid - awful might happen 1 0 1 1  Total GAD 7 Score 7 0 7 13  Anxiety Difficulty Somewhat difficult  Somewhat difficult        Assessment & Plan:  Assessment & Plan   Pregnancy, unspecified gestational age Confirmed early pregnancy at 5 weeks and 4 days by LMP. Addressed concerns  about previous hemorrhage and reassured her about non-recurrence. Discussed prenatal care importance and cessation of smoking and tanning. - Perform urine pregnancy test to confirm pregnancy. - Refer to [facility] for prenatal care and planning. - Prescribe prenatal vitamins. - Advise cessation of smoking and tanning bed use. -     Pregnancy, urine -     Ambulatory referral to Obstetrics / Gynecology   Follow up plan: Return if symptoms worsen or fail to improve.  Hadassah Letters, MD

## 2024-04-02 ENCOUNTER — Other Ambulatory Visit: Payer: Self-pay | Admitting: Pediatrics

## 2024-04-02 ENCOUNTER — Telehealth: Payer: Self-pay

## 2024-04-02 ENCOUNTER — Ambulatory Visit: Payer: Self-pay | Admitting: Pediatrics

## 2024-04-02 DIAGNOSIS — Z3490 Encounter for supervision of normal pregnancy, unspecified, unspecified trimester: Secondary | ICD-10-CM

## 2024-04-02 MED ORDER — PRENATAL PLUS VITAMIN/MINERAL 27-1 MG PO TABS
ORAL_TABLET | ORAL | 0 refills | Status: DC
Start: 2024-04-02 — End: 2024-04-29

## 2024-04-02 NOTE — Telephone Encounter (Signed)
 Copied from CRM [id number]. Topic: Clinical - Prescription Issue >> Apr 02, 2024  9:59 AM DeAngela L wrote: Reason for CRM: Pt went to pharmacy to pick up Prenatal Vit-Fe Fumarate-FA (PRENATAL MULTIVITAMIN) TABS tablet Pharmacy says they have not received the prescription  [id number] [place], [place] - [address] AT [facility] [address] [place] [address] [place] [address] Phone: [phone] Fax: [phone]  Patient number [phone] (M)

## 2024-04-06 ENCOUNTER — Ambulatory Visit: Payer: Self-pay

## 2024-04-06 ENCOUNTER — Ambulatory Visit

## 2024-04-06 DIAGNOSIS — R42 Dizziness and giddiness: Secondary | ICD-10-CM

## 2024-04-06 NOTE — Telephone Encounter (Signed)
  Chief Complaint: vaginal bleeding in pregnancy Symptoms: mild left lower back/flank pain, spotting to mild vaginal bleeding Frequency: x 1 day Pertinent Negatives: Patient denies dizziness, lightheaded, blood clots, nausea, vomiting, cramping, abdominal pain, fever. Disposition: [] ED /[] Urgent Care (no appt availability in office) / [x] Appointment(In office/virtual)/ []  [facility]/ [] Home Care/ [] Refused Recommended Disposition /[] [facility]/ []  Follow-up with PCP Additional Notes: Patient reports being [redacted] weeks pregnant with positive home pregnancy test. Patient states she had intercourse with her partner yesterday and has been having spotting since. She states today the bleeding is more mild with some red color to it. Patient states she has a tampon in. Advised patient to abstain from intercourse until appt tomorrow and to not use tampons, instead use a sanitary pad. No available appt with PCP, scheduled with Dr Lincoln Renshaw.  Copied from CRM [id number]. Topic: Clinical - Red Word Triage >> Apr 06, 2024  8:02 AM Everette C wrote: [facility] that prompted transfer to Nurse Triage: The patient is currently [redacted] weeks pregnant and began to experience sudden bleeding yesterday 04/05/24 Reason for Disposition  MILD vaginal bleeding (i.e., less than 1 pad / hour; less than patient's usual menstrual bleeding; not just spotting)  Answer Assessment - Initial Assessment Questions 1. ONSET: "When did this bleeding start?"       Yesterday, started as spotting.  2. DESCRIPTION: "Describe the bleeding that you are having." "How much bleeding is there?"    - SPOTTING: spotting, or pinkish / brownish mucous discharge; does not fill panty liner or pad    - MILD:  less than 1 pad / hour; less than patient's usual menstrual bleeding   - MODERATE: 1-2 pads / hour; 1 menstrual cup every 6 hours; small-medium blood clots (e.g., pea, grape, small coin)   - SEVERE: soaking 2 or more pads/hour for 2 or  more hours; 1 menstrual cup every 2 hours; bleeding not contained by pads or continuous red blood from vagina; large blood clots (e.g., golf ball, large coin)      Spotting yesterday, today reports it is red (not bright red or dark red). Mild today.  3. ABDOMINAL PAIN SEVERITY: If present, ask: "How bad is it?"  (e.g., Scale 1-10; mild, moderate, or severe)   - MILD (1-3): doesn't interfere with normal activities, abdomen soft and not tender to touch    - MODERATE (4-7): interferes with normal activities or awakens from sleep, abdomen tender to touch    - SEVERE (8-10): excruciating pain, doubled over, unable to do any normal activities     Denies.  4. PREGNANCY: "Do you know how many weeks or months pregnant you are?" "When was the first day of your last normal menstrual period?"     5 weeks, LMP: 02/22/24.  5. HEMODYNAMIC STATUS: "Are you weak or feeling lightheaded?" If Yes, ask: "Can you stand and walk normally?"      Denies weakness or lightheaded. Can stand and walk normal.  6. OTHER SYMPTOMS: "What other symptoms are you having with the bleeding?" (e.g., passed tissue, vaginal discharge, fever, menstrual-type cramps)     Left lower back/flank pain (mild cramping.  Protocols used: Pregnancy - Vaginal Bleeding Less Than [redacted] Weeks EGA-A-AH

## 2024-04-07 ENCOUNTER — Ambulatory Visit: Admitting: Pediatrics

## 2024-04-07 ENCOUNTER — Ambulatory Visit
Admission: RE | Admit: 2024-04-07 | Discharge: 2024-04-07 | Disposition: A | Discharge status: Home / Self Care | Source: Ambulatory Visit | Attending: Pediatrics | Admitting: Pediatrics

## 2024-04-07 ENCOUNTER — Encounter: Payer: Self-pay | Admitting: Pediatrics

## 2024-04-07 VITALS — BP 107/67 | HR 60 | Temp 97.7°F | Wt 104.8 lb

## 2024-04-07 DIAGNOSIS — O4690 Antepartum hemorrhage, unspecified, unspecified trimester: Secondary | ICD-10-CM | POA: Diagnosis present

## 2024-04-07 DIAGNOSIS — Z3490 Encounter for supervision of normal pregnancy, unspecified, unspecified trimester: Secondary | ICD-10-CM | POA: Diagnosis not present

## 2024-04-07 NOTE — Progress Notes (Addendum)
 Office Visit  BP 107/67   Pulse 60   Temp 97.7 F (36.5 C) (Oral)   Wt 104 lb 12.8 oz (47.5 kg)   LMP 02/22/2024   SpO2 99%   BMI 17.83 kg/m    Subjective:    Patient ID: Melanie [name], female    DOB: [birth year], 33 y.o.   MRN: [id number]  HPI: Melanie [name] is a 33 y.o. female  Chief Complaint  Patient presents with   Vaginal Bleeding    Spotting started yesterday noticed after intercourse    Discussed the use of AI scribe software for clinical note transcription with the patient, who gave verbal consent to proceed.  History of Present Illness   Melanie [name] is a 33 year old female who presents with spotting after intercourse during early pregnancy.  She experiences spotting after intercourse, which began the evening after the activity and has persisted. The spotting is light, not requiring a change of pad, and was red last night. No clotting is present.  She has back pain radiating down her leg and stomach cramping, though not severe enough to require medication.  She is concerned about the health of her pregnancy, especially after a recent ultrasound at a non-medical facility in [place] did not provide clear results. She recalls a previous hemorrhage during the birth of her child, Melanie [name], which adds to her anxiety about the current pregnancy. She has been actively seeking information online, which has contributed to her anxiety.     Relevant past medical, surgical, family and social history reviewed and updated as indicated. Interim medical history since our last visit reviewed. Allergies and medications reviewed and updated.  ROS per HPI unless specifically indicated above     Objective:     BP 107/67   Pulse 60   Temp 97.7 F (36.5 C) (Oral)   Wt 104 lb 12.8 oz (47.5 kg)   LMP 02/22/2024   SpO2 99%   BMI 17.83 kg/m   Wt Readings from Last 3 Encounters:  04/07/24 104 lb 12.8 oz (47.5 kg)  04/01/24 101 lb 12.8 oz (46.2 kg)  12/26/23 99 lb (44.9 kg)     Physical  Exam Constitutional:      Appearance: Normal appearance.  Pulmonary:     Effort: Pulmonary effort is normal.  Abdominal:     Tenderness: There is no abdominal tenderness.  Musculoskeletal:        General: Normal range of motion.  Skin:    Comments: Normal skin color  Neurological:     General: No focal deficit present.     Mental Status: She is alert. Mental status is at baseline.  Psychiatric:        Mood and Affect: Mood normal.        Behavior: Behavior normal.        Thought Content: Thought content normal.         04/07/2024   11:36 AM 04/01/2024    1:33 PM 02/09/2024    1:16 PM 12/26/2023    1:10 PM 10/02/2023   10:18 AM  Depression screen PHQ 2/9  Decreased Interest 1 3 0 1 2  Down, Depressed, Hopeless 1 2 0  1  PHQ - 2 Score 2 5 0 1 3  Altered sleeping 1 0  1 0  Tired, decreased energy 1 1  1 3   Change in appetite 1 0  1 1  Feeling bad or failure about yourself  1 0  1 1  Trouble concentrating  1 0  1 0  Moving slowly or fidgety/restless 1 0  1 0  Suicidal thoughts 1 0  1 0  PHQ-9 Score 9 6  8 8   Difficult doing work/chores Somewhat difficult Somewhat difficult  Somewhat difficult        04/07/2024   11:37 AM 04/01/2024    1:33 PM 02/09/2024    1:17 PM 12/26/2023    1:11 PM  GAD 7 : Generalized Anxiety Score  Nervous, Anxious, on Edge 1 1 0 1  Control/stop worrying 3 1 0 1  Worry too much - different things 3 1 0 1  Trouble relaxing 3 1 0 1  Restless 2 1 0 1  Easily annoyed or irritable 3 1 0 1  Afraid - awful might happen 3 1 0 1  Total GAD 7 Score 18 7 0 7  Anxiety Difficulty Very difficult Somewhat difficult  Somewhat difficult       Assessment & Plan:  Assessment & Plan   Pregnancy, unspecified gestational age Vaginal bleeding in pregnancy Spotting likely due to cervical irritation after intercourse. Almost resolved, no clotting, no abdominal pain. Early pregnancy with concerns about viability. Transvaginal ultrasound recommended for definitive  assessment. Defer pelvic exam until confirmed viable pregnancy.  - Order transvaginal ultrasound to assess pregnancy status and determine gestational age. -     US  OB BEFORE 14 WEEKS  Follow up plan: Return if symptoms worsen or fail to improve.  Hadassah Letters, MD

## 2024-04-07 NOTE — Patient Instructions (Signed)
 Ultrasound they will call

## 2024-04-07 NOTE — Addendum Note (Signed)
 Addended by: Hadassah Letters on: 04/07/2024 12:10 PM   Modules accepted: Orders

## 2024-04-08 ENCOUNTER — Emergency Department
Admission: EM | Admit: 2024-04-08 | Discharge: 2024-04-08 | Disposition: A | Discharge status: Home / Self Care | Attending: Emergency Medicine | Admitting: Emergency Medicine

## 2024-04-08 ENCOUNTER — Ambulatory Visit: Payer: Self-pay | Admitting: Pediatrics

## 2024-04-08 ENCOUNTER — Ambulatory Visit: Payer: Self-pay

## 2024-04-08 ENCOUNTER — Other Ambulatory Visit: Payer: Self-pay

## 2024-04-08 ENCOUNTER — Emergency Department

## 2024-04-08 DIAGNOSIS — O200 Threatened abortion: Secondary | ICD-10-CM | POA: Insufficient documentation

## 2024-04-08 DIAGNOSIS — Z3A01 Less than 8 weeks gestation of pregnancy: Secondary | ICD-10-CM | POA: Diagnosis not present

## 2024-04-08 DIAGNOSIS — O209 Hemorrhage in early pregnancy, unspecified: Secondary | ICD-10-CM

## 2024-04-08 LAB — BASIC METABOLIC PANEL WITH GFR
Anion gap: 9 (ref 5–15)
BUN: 6 mg/dL (ref 6–20)
CO2: 24 mmol/L (ref 22–32)
Calcium: 8.6 mg/dL — ABNORMAL LOW (ref 8.9–10.3)
Chloride: 105 mmol/L (ref 98–111)
Creatinine, Ser: 0.74 mg/dL (ref 0.44–1.00)
GFR, Estimated: >60 mL/min (ref >60)
Glucose, Bld: 112 mg/dL — ABNORMAL HIGH (ref 70–99)
Potassium: 3.6 mmol/L (ref 3.5–5.1)
Sodium: 138 mmol/L (ref 135–145)

## 2024-04-08 LAB — CBC
HCT: 41.1 % (ref 36.0–46.0)
Hemoglobin: 14.3 g/dL (ref 12.0–15.0)
MCH: 35 pg — ABNORMAL HIGH (ref 26.0–34.0)
MCHC: 34.8 g/dL (ref 30.0–36.0)
MCV: 100.7 fL — ABNORMAL HIGH (ref 80.0–100.0)
Platelets: 182 10*3/uL (ref 150–400)
RBC: 4.08 MIL/uL (ref 3.87–5.11)
RDW: 12.8 % (ref 11.5–15.5)
WBC: 10.3 10*3/uL (ref 4.0–10.5)
nRBC: 0 % (ref 0.0–0.2)

## 2024-04-08 LAB — ABO/RH: ABO/RH(D): A POS

## 2024-04-08 LAB — HCG, QUANTITATIVE, PREGNANCY: hCG, Beta Chain, Quant, S: 112 m[IU]/mL — ABNORMAL HIGH (ref <5)

## 2024-04-08 MED ORDER — TRAMADOL HCL 50 MG PO TABS
50.0000 mg | ORAL_TABLET | Freq: Once | ORAL | Status: AC
  Administered 2024-04-08: 50 mg via ORAL
  Filled 2024-04-08: qty 1

## 2024-04-08 MED ORDER — HYDROCODONE-ACETAMINOPHEN 5-325 MG PO TABS
1.0000 | ORAL_TABLET | Freq: Once | ORAL | Status: AC
  Administered 2024-04-08: 1 via ORAL
  Filled 2024-04-08: qty 1

## 2024-04-08 MED ORDER — HYDROCODONE-ACETAMINOPHEN 5-325 MG PO TABS: 1.0000 | ORAL_TABLET | Freq: Four times a day (QID) | ORAL | 0 refills | Status: AC | PRN

## 2024-04-08 NOTE — ED Triage Notes (Signed)
 Pt to ED via POV from home. Pt reports approximately 3-[redacted]wks pregnant. LMP sometime in April. Pt reports vaginal bleeding and cramping that started 2 days ago. No blood clots. Pt is G2P1.

## 2024-04-08 NOTE — Discharge Instructions (Signed)
 Please follow up with gynecology or primary care next week.  Return to the ER if bleeding becomes heavy and you are changing a pad every 1-2 hours.

## 2024-04-08 NOTE — Telephone Encounter (Signed)
 Copied from CRM [id number]. Topic: Clinical - Red Word Triage >> Apr 08, 2024  9:26 AM Elle L wrote: Red Word that prompted transfer to Nurse Triage: The patient thinks she may be miscarrying. She is having intense severe cramps and she is bleeding profusely.   Chief Complaint: Miscarriage - Suspected   Symptoms: Cramping, Vaginal Bleeding, Lightheadedness  Frequency: Acute  Pertinent Negatives: Patient denies fever, passed tissue, or   Disposition: [x] ED /[] Urgent Care (no appt availability in office) / [] Appointment(In office/virtual)/ []  [facility]/ [] Home Care/ [] Refused Recommended Disposition /[] [facility]/ []  Follow-up with PCP  Additional Notes: KL is being triaged for a suspected miscarriage. The patient is reporting cramping and profuse bleeding that started today. The patient went for an Ultrasound recently and has not heard from the results. The patient denies any visible passed tissue, but the cramps are intense and the bleeding is filling up pads. Advised the patient to go to the ED for emergency evaluation. Patient verbalized understanding and agreed  to disposition.   Reason for Disposition  SEVERE dizziness (e.g., unable to stand, requires support to walk, feels like passing out)  Answer Assessment - Initial Assessment Questions 1. ONSET: "When did this bleeding start?"       Today  2. DESCRIPTION: "Describe the bleeding that you are having." "How much bleeding is there?"    - SPOTTING: spotting, or pinkish / brownish mucous discharge; does not fill panty liner or pad    - MILD:  less than 1 pad / hour; less than patient's usual menstrual bleeding   - MODERATE: 1-2 pads / hour; 1 menstrual cup every 6 hours; small-medium blood clots (e.g., pea, grape, small coin)   - SEVERE: soaking 2 or more pads/hour for 2 or more hours; 1 menstrual cup every 2 hours; bleeding not contained by pads or continuous red blood from vagina; large blood clots (e.g.,  golf ball, large coin)      Severe  3. ABDOMINAL PAIN SEVERITY: If present, ask: "How bad is it?"  (e.g., Scale 1-10; mild, moderate, or severe)   - MILD (1-3): doesn't interfere with normal activities, abdomen soft and not tender to touch    - MODERATE (4-7): interferes with normal activities or awakens from sleep, abdomen tender to touch    - SEVERE (8-10): excruciating pain, doubled over, unable to do any normal activities     8  4. PREGNANCY: "Do you know how many weeks or months pregnant you are?" "When was the first day of your last normal menstrual period?"     Unsure  5. HEMODYNAMIC STATUS: "Are you weak or feeling lightheaded?" If Yes, ask: "Can you stand and walk normally?"      Lightheaded  6. OTHER SYMPTOMS: "What other symptoms are you having with the bleeding?" (e.g., passed tissue, vaginal discharge, fever, menstrual-type cramps)     Cramping  Protocols used: Pregnancy - Vaginal Bleeding Less Than [redacted] Weeks EGA-A-AH

## 2024-04-08 NOTE — ED Provider Notes (Signed)
 [facility] Provider Note    Event Date/Time   First MD Initiated Contact with Patient 04/08/24 1146     (approximate)   History   Vaginal Bleeding   HPI  Melanie [name] is a 33 y.o. female with history of anemia, panic disorder and anxiety and as listed in EMR presents to the emergency department for treatment and evaluation of vaginal bleeding.  She is approximately [redacted] weeks pregnant.  She had some spotting yesterday but upon awakening this morning when she stood up she had a gush of blood.  Cramping is diffuse across the abdomen.  She has had no nausea or vomiting.  Gravida 2 para 1.      Physical Exam   Triage Vital Signs: ED Triage Vitals  Encounter Vitals Group     BP 04/08/24 1052 111/81     Systolic BP Percentile --      Diastolic BP Percentile --      Pulse Rate 04/08/24 1052 74     Resp 04/08/24 1052 20     Temp 04/08/24 1052 98.2 F (36.8 C)     Temp Source 04/08/24 1052 Oral     SpO2 04/08/24 1052 98 %     Weight 04/08/24 1145 105 lb 13.1 oz (48 kg)     Height 04/08/24 1145 5' 4.25" (1.632 m)     Head Circumference --      Peak Flow --      Pain Score 04/08/24 1053 9     Pain Loc --      Pain Education --      Exclude from Growth Chart --     Most recent vital signs: Vitals:   04/08/24 1052 04/08/24 1521  BP: 111/81 110/78  Pulse: 74 70  Resp: 20 18  Temp: 98.2 F (36.8 C) 98 F (36.7 C)  SpO2: 98% 98%    General: Awake, no distress.  CV:  Good peripheral perfusion.  Resp:  Normal effort.  Abd:  No distention. Soft. Other:     ED Results / Procedures / Treatments   Labs (all labs ordered are listed, but only abnormal results are displayed) Labs Reviewed  CBC - Abnormal; Notable for the following components:      Result Value   MCV 100.7 (*)    MCH 35.0 (*)    All other components within normal limits  BASIC METABOLIC PANEL WITH GFR - Abnormal; Notable for the following components:   Glucose, Bld 112 (*)     Calcium 8.6 (*)    All other components within normal limits  HCG, QUANTITATIVE, PREGNANCY - Abnormal; Notable for the following components:   hCG, Beta Chain, Quant, S 112 (*)    All other components within normal limits  ABO/RH     EKG  Not indicated   RADIOLOGY  Image and radiology report reviewed and interpreted by me. Radiology report consistent with the same.  OB ultrasound shows no intrauterine gestational sac.  The lower uterine segment endometrium is heterogenous and thickened.  There is no adnexal mass or significant free fluid.  Findings may be related to spontaneous abortion, ectopic pregnancy or early normal IUP.  PROCEDURES:  Critical Care performed: No  Procedures   MEDICATIONS ORDERED IN ED:  Medications  traMADol  (ULTRAM ) tablet 50 mg (50 mg Oral Given 04/08/24 1343)  HYDROcodone -acetaminophen  (NORCO/VICODIN) 5-325 MG per tablet 1 tablet (1 tablet Oral Given 04/08/24 1536)     IMPRESSION / MDM / ASSESSMENT AND  PLAN / ED COURSE   I have reviewed the triage note.  Differential diagnosis includes, but is not limited to, abdominal pain in pregnancy, miscarriage, threatened miscarriage, ectopic pregnancy  Patient's presentation is most consistent with acute presentation with potential threat to life or bodily function.  33 year old female presenting to the emergency department for treatment and evaluation of vaginal bleeding in early pregnancy.  See HPI for further details.  Vital signs are reassuring.  She is not tachycardic and blood pressure is normal.  Labs obtained while awaiting ER room assignment indicate no concern for infection or anemia.  BMP is normal as well.  Beta-hCG is 112 and the ABO Rh is pending.  On exam, she has diffuse abdominal cramping that she reports feels like a more painful menstrual cycle.  She has no focal abdominal tenderness.  Plan will be to get an ultrasound to evaluate for location of pregnancy.  She was advised that  anatomic details of the pregnancy will not be visualized during this ultrasound.  She is aware and agreeable to the plan.  Clinical Course as of 04/08/24 1803  Thu Apr 08, 2024  1534 Awaiting results of ultrasound.  Patient complaining of increasing pain.  Tramadol  had been given earlier.  Hydrocodone  ordered now. [CT]  1600 Ultrasound without evidence of IUP.  Clinical impression is miscarriage.  Patient has photographs of large clots that have been expelled while being here in the emergency department.  Results of the ultrasound were discussed with her.  She is aware that she will still need to follow-up with gynecology for labs and ultrasound to ensure that the HCG is not rising and if actively miscarrying all products of conception are gone. ER return precautions discussed as well. [CT]    Clinical Course User Index [CT] Carol Theys B, FNP     FINAL CLINICAL IMPRESSION(S) / ED DIAGNOSES   Final diagnoses:  Threatened miscarriage     Rx / DC Orders   ED Discharge Orders          Ordered    HYDROcodone -acetaminophen  (NORCO/VICODIN) 5-325 MG tablet  Every 6 hours PRN        04/08/24 1602             Note:  This document was prepared using Dragon voice recognition software and may include unintentional dictation errors.   Sherryle Don, FNP 04/08/24 Darrick Embs    Iver Marker, MD 04/18/24 [phone]

## 2024-04-08 NOTE — ED Notes (Signed)
 Has been up to the bathroom a couple of times  Passing mod size clots  Conts to have cramping

## 2024-04-09 ENCOUNTER — Other Ambulatory Visit: Payer: Self-pay | Admitting: Pediatrics

## 2024-04-09 ENCOUNTER — Telehealth: Payer: Self-pay

## 2024-04-09 NOTE — Telephone Encounter (Signed)
 Please advise?  Copied from CRM [id number]. Topic: Clinical - Medication Question >> Apr 08, 2024  3:29 PM Sasha H wrote: Reason for CRM: pt is wanting to know if her provider can prescribe her something for pain.

## 2024-04-13 ENCOUNTER — Ambulatory Visit

## 2024-04-14 NOTE — Progress Notes (Unsigned)
 GYNECOLOGY PROGRESS NOTE  Subjective:  PCP: Hadassah Letters, MD  Patient ID: Melanie [name], female    DOB: March 04, 1991, 33 y.o.   MRN: [id number]  HPI  Patient is a 33 y.o. G96P1011 female who presents for f/up miscarriage from ER visit on 5/22. LMP 02/22/24 with positive test 04/01/24 and PCP's office. Started spotting 5/20, bleeding heavier 5/22 and presented to ED. US  showed no IUP and HCG was 112. Since that time, bleeding has lightened, is still spotting. This pregnancy was unplanned, but after initial shock, she wanted to keep. Now wondering about getting pregnant again and how best to accomplish, has questions about timing.  She is mentally doing ok, has support.   OB History  Gravida Para Term Preterm AB Living  2 1 1  1 1   SAB IAB Ectopic Multiple Live Births  1    1    # Outcome Date GA Lbr Len/2nd Weight Sex Type Anes PTL Lv  2 SAB 04/08/24 [redacted]w[redacted]d    SAB     1 Term 04/2011    M Vag-Spont   LIV   Past Medical History:  Diagnosis Date   Iron deficiency    Past Surgical History:  Procedure Laterality Date   WISDOM TOOTH EXTRACTION     Family History  Problem Relation Age of Onset   Ovarian cancer Maternal Grandmother    Social History   Socioeconomic History   Marital status: Married    Spouse name: Austine Lefort   Number of children: 0   Years of education: Not on file   Highest education level: Not on file  Occupational History   Not on file  Tobacco Use   Smoking status: Every Day    Current packs/day: 0.50    Types: Cigarettes   Smokeless tobacco: Never  Vaping Use   Vaping status: Never Used  Substance and Sexual Activity   Alcohol use: No   Drug use: No   Sexual activity: Yes    Birth control/protection: None  Other Topics Concern   Not on file  Social History Narrative   Not on file   Social Drivers of Health   Financial Resource Strain: Low Risk  (12/26/2023)   Overall Financial Resource Strain (CARDIA)    Difficulty of Paying Living Expenses: Not hard at  all  Food Insecurity: Food Insecurity Present (08/01/2021)   Received [facility]   Hunger Vital Sign    Worried About Running Out of Food in the Last Year: Often true    Ran Out of Food in the Last Year: Sometimes true  Transportation Needs: Unmet Transportation Needs (08/01/2021)   Received from [facility], [facility]   PRAPARE - Transportation    Lack of Transportation (Medical): Yes    Lack of Transportation (Non-Medical): Yes  Physical Activity: Insufficiently Active (12/26/2023)   Exercise Vital Sign    Days of Exercise per Week: 5 days    Minutes of Exercise per Session: 10 min  Stress: Stress Concern Present (12/26/2023)   [employer] of Occupational Health - Occupational Stress Questionnaire    Feeling of Stress : To some extent  Social Connections: Moderately Isolated (12/26/2023)   Social Connection and Isolation Panel [NHANES]    Frequency of Communication with Friends and Family: Three times a week    Frequency of Social Gatherings with Friends and Family: Never    Attends Religious Services: Never    Active Member of Clubs or  Organizations: No    Attends Banker Meetings: Never    Marital Status: Married  Catering manager Violence: Not on file   Current Outpatient Medications on File Prior to Visit  Medication Sig Dispense Refill   albuterol  (VENTOLIN  HFA) 108 (90 Base) MCG/ACT inhaler Inhale 2 puffs into the lungs every 6 (six) hours as needed for wheezing or shortness of breath. (Patient not taking: Reported on 04/15/2024) 8 g 2   Prenatal Vit-Fe Fumarate-FA (PRENATAL PLUS VITAMIN/MINERAL) 27-1 MG TABS Take on 1 tablet daily (Patient not taking: Reported on 04/15/2024) 270 tablet 0   vitamin B-12 (CYANOCOBALAMIN) 500 MCG tablet Take 500 mcg by mouth daily. (Patient not taking: Reported on 04/15/2024)     No current facility-administered medications on file prior to visit.   No Known Allergies   Review of  Systems Pertinent items are noted in HPI.   Objective:   Blood pressure (!) 105/59, pulse 87, height 5\' 4"  (1.626 m), weight 102 lb (46.3 kg), last menstrual period 02/22/2024. Body mass index is 17.51 kg/m.  General appearance: alert, cooperative, and no distress Abdomen: soft, non-tender; bowel sounds normal; no masses,  no organomegaly Pelvic: deferred Extremities: extremities normal, atraumatic, no cyanosis or edema Neurologic: Grossly normal  Assessment/Plan:   1. Miscarriage   33 y.o. G2P1011 s/p miscarriage on 04/08/24 at approx 6wks, US  showing no IUP or retained POCs and HCG on 5/22 at 112. We discussed management from here, reassurance given for some of patient's fears (sepsis, needing hysterectomy), and offered follow up with labs and/or US  based on her preference. She would like to check HCG at the 4wk interval, so in 3 more weeks. We discussed it can take 60 days for HCG to clear the system, so it may not be gone by then. Can also take 8-12 weeks to resume normal menses, she is interested in getting pregnant again, we went over tracking periods, timing intercourse, use of OPKs if she wants. No clear data on how long to wait after [facility] to conceive, reviewed this with patient and she can begin trying when she feels ready.  -Start PNV today -Stop or cut back smoking -Repeat HCG 3wks -Notify clinic if no menses after 3 mos, pregnant, or for other concerns    Total time was 35 minutes. That includes chart review before the visit, the actual patient visit, and time spent on documentation after the visit. Time excludes procedures, if any.    Sofia Dunn, DO [facility]

## 2024-04-15 ENCOUNTER — Encounter: Payer: Self-pay | Admitting: Obstetrics

## 2024-04-15 ENCOUNTER — Ambulatory Visit: Admitting: Obstetrics

## 2024-04-15 VITALS — BP 105/59 | HR 87 | Ht 64.0 in | Wt 102.0 lb

## 2024-04-15 DIAGNOSIS — Z09 Encounter for follow-up examination after completed treatment for conditions other than malignant neoplasm: Secondary | ICD-10-CM | POA: Diagnosis not present

## 2024-04-15 DIAGNOSIS — O039 Complete or unspecified spontaneous abortion without complication: Secondary | ICD-10-CM | POA: Diagnosis not present

## 2024-04-15 DIAGNOSIS — Z3A01 Less than 8 weeks gestation of pregnancy: Secondary | ICD-10-CM

## 2024-04-16 ENCOUNTER — Encounter: Payer: Self-pay | Admitting: Obstetrics

## 2024-04-19 ENCOUNTER — Telehealth

## 2024-04-20 ENCOUNTER — Encounter

## 2024-04-21 ENCOUNTER — Other Ambulatory Visit

## 2024-04-21 ENCOUNTER — Ambulatory Visit: Admitting: Pediatrics

## 2024-04-21 DIAGNOSIS — O039 Complete or unspecified spontaneous abortion without complication: Secondary | ICD-10-CM

## 2024-04-22 ENCOUNTER — Telehealth: Payer: Self-pay

## 2024-04-22 LAB — BETA HCG QUANT (REF LAB): hCG Quant: 453 m[IU]/mL

## 2024-04-22 NOTE — Telephone Encounter (Signed)
 Pt called triage confused her Beta is 453 and her ultrasound showed no pregnancy. Pt states she had a miscarriage In April. I advised maybe its retained products. She wants to be seen to have medication to help pass any left product. Pt added to [facility] schedule tomorrow 835am

## 2024-04-22 NOTE — Progress Notes (Unsigned)
    GYNECOLOGY PROGRESS NOTE  Subjective:  PCP: Hadassah Letters, MD  Patient ID: Melanie [name], female    DOB: [birth year], 33 y.o.   MRN: [id number]  HPI  Patient is a 33 y.o. G50P1011 female who presents for miscarriage f/up. She states she had Beta done yesterday and results were 453. She is confused because her ultrasound showed No intrauterine gestational sac identified. Pt requesting medication to help pass retained products.   {Common ambulatory SmartLinks:19316}  Review of Systems {ros; complete:30496}   Objective:   Last menstrual period 02/22/2024. There is no height or weight on file to calculate BMI.  General appearance: {general exam:16600} Abdomen: {abdominal exam:16834} Pelvic: {pelvic exam:16852::"cervix normal in appearance","external genitalia normal","no adnexal masses or tenderness","no cervical motion tenderness","rectovaginal septum normal","uterus normal size, shape, and consistency","vagina normal without discharge"} Extremities: {extremity exam:5109} Neurologic: {neuro exam:17854}   Assessment/Plan:   No diagnosis found.   There are no diagnoses linked to this encounter.     Sofia Dunn, DO [facility]

## 2024-04-23 ENCOUNTER — Encounter: Payer: Self-pay | Admitting: Obstetrics

## 2024-04-23 ENCOUNTER — Ambulatory Visit (INDEPENDENT_AMBULATORY_CARE_PROVIDER_SITE_OTHER): Admitting: Obstetrics

## 2024-04-23 ENCOUNTER — Encounter: Payer: Self-pay | Admitting: Pediatrics

## 2024-04-23 ENCOUNTER — Telehealth (INDEPENDENT_AMBULATORY_CARE_PROVIDER_SITE_OTHER): Admitting: Pediatrics

## 2024-04-23 ENCOUNTER — Ambulatory Visit: Admitting: Pediatrics

## 2024-04-23 ENCOUNTER — Ambulatory Visit
Admission: RE | Admit: 2024-04-23 | Discharge: 2024-04-23 | Disposition: A | Discharge status: Home / Self Care | Source: Ambulatory Visit | Attending: Obstetrics | Admitting: Obstetrics

## 2024-04-23 ENCOUNTER — Other Ambulatory Visit
Admission: RE | Admit: 2024-04-23 | Discharge: 2024-04-23 | Disposition: A | Discharge status: Home / Self Care | Source: Ambulatory Visit | Attending: Obstetrics | Admitting: Obstetrics

## 2024-04-23 VITALS — BP 92/64 | HR 64 | Ht 64.0 in | Wt 99.0 lb

## 2024-04-23 DIAGNOSIS — Z3A01 Less than 8 weeks gestation of pregnancy: Secondary | ICD-10-CM

## 2024-04-23 DIAGNOSIS — F410 Panic disorder [episodic paroxysmal anxiety] without agoraphobia: Secondary | ICD-10-CM | POA: Diagnosis not present

## 2024-04-23 DIAGNOSIS — O039 Complete or unspecified spontaneous abortion without complication: Secondary | ICD-10-CM

## 2024-04-23 DIAGNOSIS — O00109 Unspecified tubal pregnancy without intrauterine pregnancy: Secondary | ICD-10-CM

## 2024-04-23 DIAGNOSIS — O034 Incomplete spontaneous abortion without complication: Secondary | ICD-10-CM | POA: Insufficient documentation

## 2024-04-23 HISTORY — DX: Unspecified tubal pregnancy without intrauterine pregnancy: O00.109

## 2024-04-23 LAB — HCG, QUANTITATIVE, PREGNANCY: hCG, Beta Chain, Quant, S: 556 m[IU]/mL — ABNORMAL HIGH (ref <5)

## 2024-04-23 LAB — CBC WITH DIFFERENTIAL/PLATELET
Abs Immature Granulocytes: 0.02 10*3/uL (ref 0.00–0.07)
Basophils Absolute: 0.1 10*3/uL (ref 0.0–0.1)
Basophils Relative: 1 %
Eosinophils Absolute: 0.1 10*3/uL (ref 0.0–0.5)
Eosinophils Relative: 1 %
HCT: 42.7 % (ref 36.0–46.0)
Hemoglobin: 14.9 g/dL (ref 12.0–15.0)
Immature Granulocytes: 0 %
Lymphocytes Relative: 43 %
Lymphs Abs: 2.9 10*3/uL (ref 0.7–4.0)
MCH: 35 pg — ABNORMAL HIGH (ref 26.0–34.0)
MCHC: 34.9 g/dL (ref 30.0–36.0)
MCV: 100.2 fL — ABNORMAL HIGH (ref 80.0–100.0)
Monocytes Absolute: 0.4 10*3/uL (ref 0.1–1.0)
Monocytes Relative: 6 %
Neutro Abs: 3.3 10*3/uL (ref 1.7–7.7)
Neutrophils Relative %: 49 %
Platelets: 188 10*3/uL (ref 150–400)
RBC: 4.26 MIL/uL (ref 3.87–5.11)
RDW: 12.9 % (ref 11.5–15.5)
WBC: 6.8 10*3/uL (ref 4.0–10.5)
nRBC: 0 % (ref 0.0–0.2)

## 2024-04-23 LAB — COMPREHENSIVE METABOLIC PANEL WITH GFR
ALT: 18 U/L (ref 0–44)
AST: 22 U/L (ref 15–41)
Albumin: 4.4 g/dL (ref 3.5–5.0)
Alkaline Phosphatase: 53 U/L (ref 38–126)
Anion gap: 8 (ref 5–15)
BUN: 8 mg/dL (ref 6–20)
CO2: 24 mmol/L (ref 22–32)
Calcium: 9.2 mg/dL (ref 8.9–10.3)
Chloride: 107 mmol/L (ref 98–111)
Creatinine, Ser: 0.68 mg/dL (ref 0.44–1.00)
GFR, Estimated: >60 mL/min (ref >60)
Glucose, Bld: 95 mg/dL (ref 70–99)
Potassium: 3.7 mmol/L (ref 3.5–5.1)
Sodium: 139 mmol/L (ref 135–145)
Total Bilirubin: 0.7 mg/dL (ref 0.0–1.2)
Total Protein: 7.5 g/dL (ref 6.5–8.1)

## 2024-04-23 MED ORDER — METHOTREXATE FOR ECTOPIC PREGNANCY
50.0000 mg/m2 | Freq: Once | INTRAMUSCULAR | Status: AC
  Administered 2024-04-23: 70 mg via INTRAMUSCULAR
  Filled 2024-04-23: qty 2.8

## 2024-04-23 MED ORDER — OXYCODONE HCL 5 MG PO TABS: 5.0000 mg | ORAL_TABLET | Freq: Four times a day (QID) | ORAL | 0 refills | Status: AC | PRN

## 2024-04-23 MED ORDER — CLONAZEPAM 0.25 MG PO TBDP: 0.2500 mg | ORAL_TABLET | Freq: Every day | ORAL | 0 refills | Status: DC | PRN

## 2024-04-23 NOTE — Progress Notes (Signed)
 Telehealth Visit  I connected with  Melanie [name] on 04/23/24 by a video enabled telemedicine application and verified that I am speaking with the correct person using two identifiers.   I discussed the limitations of evaluation and management by telemedicine. The patient expressed understanding and agreed to proceed.  Subjective:    Patient ID: Melanie [name], female    DOB: 08-23-1991, 33 y.o.   MRN: [id number]  HPI: Melanie [name] is a 33 y.o. female  No chief complaint on file.   Discussed the use of AI scribe software for clinical note transcription with the patient, who gave verbal consent to proceed.  History of Present Illness   Melanie [name] is a 33 year old female who presents with severe anxiety and heavy bleeding following a suspected ectopic pregnancy.  She initially sought medical attention on Sunday due to heavy bleeding and pain. An ultrasound was performed, but it did not reveal a clear location of the pregnancy. Her blood work showed hCG levels of 473 on Wednesday, which increased to 550 by Friday. She received a methotrexate injection for treatment.  She describes her anxiety as severe, feeling on the verge of a panic attack at the hospital. She has a history of using hydroxyzine, which was ineffective, while alprazolam and clonazepam have been helpful in the past. Medications like Paxil and Zoloft have worsened her symptoms. Her anxiety has prevented her from working for a week.  She experienced significant bleeding, passing blood clots, and changing pads every twenty minutes. She is unable to take ibuprofen or Tylenol  for pain relief. Tramadol  causes nausea, but she tolerates oxycodone well, which she has used for pain management in the past. No use of ibuprofen or Tylenol  for pain. Nausea with hydrocodone  but tolerates oxycodone.      Relevant past medical, surgical, family and social history reviewed and updated as indicated. Interim medical history since our last visit  reviewed. Allergies and medications reviewed and updated.  ROS per HPI unless specifically indicated above     Objective:     LMP 02/22/2024 (Approximate)   Wt Readings from Last 3 Encounters:  04/23/24 99 lb (44.9 kg)  04/15/24 102 lb (46.3 kg)  04/08/24 105 lb 13.1 oz (48 kg)     Physical Exam Constitutional:      General: She is not in acute distress.    Appearance: Normal appearance.  Neurological:     General: No focal deficit present.     Mental Status: She is alert. Mental status is at baseline.      LIMITED EXAM GIVEN VIDEO VISIT     Assessment & Plan:  Assessment & Plan   Tubal pregnancy without intrauterine pregnancy, unspecified laterality Confirmed by elevated hCG and ultrasound, ectopic. Methotrexate administered to terminate pregnancy and prevent complications. Norco not tolerated having lots of breakthrough pain, plan as below. - Monitor hCG levels with OB on Monday and Thursday. - Prescribe oxycodone 2.5 to 5 mg as needed every six hours for breakthrough pain. -     oxyCODONE HCl; Take 1 tablet (5 mg total) by mouth every 6 (six) hours as needed for up to 5 days for severe pain (pain score 7-10) or breakthrough pain.  Dispense: 12 tablet; Refill: 0  Panic disorder Assessment & Plan: Severe anxiety exacerbated by ectopic pregnancy and methotrexate. Hydroxyzine ineffective; alprazolam and clozapine helpful. Panic attacks present, unable to work. Prefer long acting benzo over short, short course of klonopin sent. - Prescribe clonazepam 0.25 mg for breakthrough  anxiety. - Advise against mixing clonazepam and oxycodone due to sedative effects. - Provide a work note for the past week and the upcoming week.   Orders: -     clonazePAM; Take 1 tablet (0.25 mg total) by mouth daily as needed for up to 7 days (anxiety).  Dispense: 7 tablet; Refill: 0    Follow up plan: Return in about 1 week (around 04/30/2024).  Melanie Letters, MD   This visit was completed  via video visit through MyChart due to the restrictions of the COVID-19 pandemic. All issues as above were discussed and addressed. Physical exam was done as above through visual confirmation on video through MyChart. If it was felt that the patient should be evaluated in the office, they were directed there. The patient verbally consented to this visit. Location of the patient: home Location of the provider: work Those involved with this call:  Provider: Geraldine Kling, MD CMA: Mancil Seat, CMA Time spent on call: 15 minutes with patient face to face via video conference. More than 50% of this time was spent in counseling and coordination of care. 15 minutes total spent in review of patient's record and preparation of their chart. Total time spent on this encounter: 30 minutes.

## 2024-04-23 NOTE — Discharge Instructions (Signed)
Following Methotrexate Administration for Ectopic Pregnancy  Your physician will obtain follow-up blood work to monitor the effect of the medication.   After receiving methotrexate avoid:  Alcoholic beverages  Vitamins containing folic acid Foods that contain folic acid, including fortified cereal, enriched bread and pasta, peanuts, dark green leafy vegetables, orange juice, and beans  Gas-forming foods  Nonsteroidal antiinflammatory painkillers  Sexual intercourse or any strenuous activity because it may cause the fallopian tube to rupture Do not become pregnant for 3 months to decrease the risk of birth defects.  You may experience side effects, like nausea, vomiting, dizziness, and mouth and lip ulcers.  Most women have abdominal pain a couple of days after the injection.  Notify your obstetric practitioner or return to the emergency department if you develop severe abdominal pain, dizziness or fainting, heavy vaginal bleeding, severe nausea and vomiting, or fever.  Double-flush the toilet with the lid closed for 72 hours after receiving the injection.   

## 2024-04-23 NOTE — Assessment & Plan Note (Addendum)
 Severe anxiety exacerbated by ectopic pregnancy and methotrexate. Hydroxyzine ineffective; alprazolam and clozapine helpful. Panic attacks present, unable to work. Prefer long acting benzo over short, short course of klonopin sent. - Prescribe clonazepam 0.25 mg for breakthrough anxiety. - Advise against mixing clonazepam and oxycodone due to sedative effects. - Provide a work note for the past week and the upcoming week.

## 2024-04-25 ENCOUNTER — Encounter: Payer: Self-pay | Admitting: Emergency Medicine

## 2024-04-25 ENCOUNTER — Emergency Department

## 2024-04-25 ENCOUNTER — Emergency Department
Admission: EM | Admit: 2024-04-25 | Discharge: 2024-04-25 | Disposition: A | Discharge status: Home / Self Care | Attending: Emergency Medicine | Admitting: Emergency Medicine

## 2024-04-25 ENCOUNTER — Other Ambulatory Visit: Payer: Self-pay

## 2024-04-25 DIAGNOSIS — Z3A00 Weeks of gestation of pregnancy not specified: Secondary | ICD-10-CM | POA: Diagnosis not present

## 2024-04-25 DIAGNOSIS — O00102 Left tubal pregnancy without intrauterine pregnancy: Secondary | ICD-10-CM | POA: Insufficient documentation

## 2024-04-25 DIAGNOSIS — O209 Hemorrhage in early pregnancy, unspecified: Secondary | ICD-10-CM | POA: Diagnosis present

## 2024-04-25 LAB — CBC
HCT: 41.4 % (ref 36.0–46.0)
Hemoglobin: 13.8 g/dL (ref 12.0–15.0)
MCH: 34.9 pg — ABNORMAL HIGH (ref 26.0–34.0)
MCHC: 33.3 g/dL (ref 30.0–36.0)
MCV: 104.8 fL — ABNORMAL HIGH (ref 80.0–100.0)
Platelets: 182 10*3/uL (ref 150–400)
RBC: 3.95 MIL/uL (ref 3.87–5.11)
RDW: 13 % (ref 11.5–15.5)
WBC: 8.6 10*3/uL (ref 4.0–10.5)
nRBC: 0 % (ref 0.0–0.2)

## 2024-04-25 LAB — URINALYSIS, ROUTINE W REFLEX MICROSCOPIC
Bilirubin Urine: NEGATIVE
Glucose, UA: NEGATIVE mg/dL
Ketones, ur: NEGATIVE mg/dL
Leukocytes,Ua: NEGATIVE
Nitrite: NEGATIVE
Protein, ur: NEGATIVE mg/dL
Specific Gravity, Urine: 1.013 (ref 1.005–1.030)
pH: 8 (ref 5.0–8.0)

## 2024-04-25 LAB — HCG, QUANTITATIVE, PREGNANCY: hCG, Beta Chain, Quant, S: 507 m[IU]/mL — ABNORMAL HIGH (ref <5)

## 2024-04-25 LAB — COMPREHENSIVE METABOLIC PANEL WITH GFR
ALT: 34 U/L (ref 0–44)
AST: 36 U/L (ref 15–41)
Albumin: 3.8 g/dL (ref 3.5–5.0)
Alkaline Phosphatase: 43 U/L (ref 38–126)
Anion gap: 9 (ref 5–15)
BUN: 8 mg/dL (ref 6–20)
CO2: 20 mmol/L — ABNORMAL LOW (ref 22–32)
Calcium: 8.7 mg/dL — ABNORMAL LOW (ref 8.9–10.3)
Chloride: 104 mmol/L (ref 98–111)
Creatinine, Ser: 0.68 mg/dL (ref 0.44–1.00)
GFR, Estimated: >60 mL/min (ref >60)
Glucose, Bld: 108 mg/dL — ABNORMAL HIGH (ref 70–99)
Potassium: 3.6 mmol/L (ref 3.5–5.1)
Sodium: 133 mmol/L — ABNORMAL LOW (ref 135–145)
Total Bilirubin: 1.2 mg/dL (ref 0.0–1.2)
Total Protein: 6.4 g/dL — ABNORMAL LOW (ref 6.5–8.1)

## 2024-04-25 LAB — TYPE AND SCREEN
ABO/RH(D): A POS
Antibody Screen: NEGATIVE

## 2024-04-25 LAB — LIPASE, BLOOD: Lipase: 34 U/L (ref 11–51)

## 2024-04-25 MED ORDER — ONDANSETRON 4 MG PO TBDP: 4.0000 mg | ORAL_TABLET | Freq: Four times a day (QID) | ORAL | 0 refills | Status: DC | PRN

## 2024-04-25 MED ORDER — SODIUM CHLORIDE 0.9 % IV BOLUS (SEPSIS)
1000.0000 mL | Freq: Once | INTRAVENOUS | Status: AC
  Administered 2024-04-25: 1000 mL via INTRAVENOUS

## 2024-04-25 MED ORDER — MORPHINE SULFATE (PF) 2 MG/ML IV SOLN
2.0000 mg | Freq: Once | INTRAVENOUS | Status: AC
  Administered 2024-04-25: 2 mg via INTRAVENOUS
  Filled 2024-04-25: qty 1

## 2024-04-25 MED ORDER — ONDANSETRON HCL 4 MG/2ML IJ SOLN
4.0000 mg | Freq: Once | INTRAMUSCULAR | Status: AC
  Administered 2024-04-25: 4 mg via INTRAVENOUS
  Filled 2024-04-25: qty 2

## 2024-04-25 MED ORDER — HYDROCODONE-ACETAMINOPHEN 5-325 MG PO TABS: 1.0000 | ORAL_TABLET | Freq: Four times a day (QID) | ORAL | 0 refills | Status: DC | PRN

## 2024-04-25 NOTE — ED Triage Notes (Addendum)
 Arrived pov for abd pain  Per pt "I had to have a methotrexate  shot on Friday for an ectopic pregnancy, having left side abd pain that radiates to my back."  Pain started around 2100 but has gotten worse.   Vaginal bleeding stopped about 4days ago, is having some brown discharge when wiping

## 2024-04-25 NOTE — Discharge Instructions (Addendum)

## 2024-04-25 NOTE — ED Provider Notes (Signed)
 [facility] Provider Note    Event Date/Time   First MD Initiated Contact with Patient 04/25/24 [phone]     (approximate)   History   Abdominal Pain   HPI  Melanie [name] is a 33 y.o. female with history of anemia who presents to the emergency department with complaints of left lower abdominal pain that started around 2 AM.  Patient recently diagnosed with an ectopic pregnancy and given an injection of methotrexate  by her OB/GYN Dr. Dell Fennel 2 days ago.  Patient states that she had not been having any pain until tonight.  She states tonight she started bleeding as well.  She is not passing clots, tissue.  She states she tried taking an oxycodone  at home which was prescribed to her but it caused her to vomit.  She is asking for something not as potent.  She denies any fevers, diarrhea, dysuria, abnormal discharge.   History provided by patient, husband.    Past Medical History:  Diagnosis Date   Iron deficiency     Past Surgical History:  Procedure Laterality Date   WISDOM TOOTH EXTRACTION      MEDICATIONS:  Prior to Admission medications   Medication Sig Start Date End Date Taking? Authorizing Provider  albuterol  (VENTOLIN  HFA) 108 (90 Base) MCG/ACT inhaler Inhale 2 puffs into the lungs every 6 (six) hours as needed for wheezing or shortness of breath. Patient not taking: Reported on 04/23/2024 10/02/23   Hadassah Letters, MD  clonazePAM  (KLONOPIN ) 0.25 MG disintegrating tablet Take 1 tablet (0.25 mg total) by mouth daily as needed for up to 7 days (anxiety). 04/23/24 04/30/24  Hadassah Letters, MD  oxyCODONE  (OXY IR/ROXICODONE ) 5 MG immediate release tablet Take 1 tablet (5 mg total) by mouth every 6 (six) hours as needed for up to 5 days for severe pain (pain score 7-10) or breakthrough pain. 04/23/24 04/28/24  Hadassah Letters, MD  Prenatal Vit-Fe Fumarate-FA (PRENATAL PLUS VITAMIN/MINERAL) 27-1 MG TABS Take on 1 tablet daily Patient not taking: Reported on 04/23/2024  04/02/24   Hadassah Letters, MD  vitamin B-12 (CYANOCOBALAMIN) 500 MCG tablet Take 500 mcg by mouth daily.    [provider]    Physical Exam   Triage Vital Signs: ED Triage Vitals [04/25/24 0307]  Encounter Vitals Group     BP 116/69     Systolic BP Percentile      Diastolic BP Percentile      Pulse Rate 75     Resp 17     Temp 97.8 F (36.6 C)     Temp Source Oral     SpO2 98 %     Weight 100 lb (45.4 kg)     Height 5\' 4"  (1.626 m)     Head Circumference      Peak Flow      Pain Score      Pain Loc      Pain Education      Exclude from Growth Chart     Most recent vital signs: Vitals:   04/25/24 0307 04/25/24 0530  BP: 116/69 (!) 98/59  Pulse: 75 79  Resp: 17   Temp: 97.8 F (36.6 C)   SpO2: 98% 100%    CONSTITUTIONAL: Alert, responds appropriately to questions. Well-appearing; well-nourished, appears anxious but no significant distress HEAD: Normocephalic, atraumatic EYES: Conjunctivae clear, pupils appear equal, sclera nonicteric ENT: normal nose; moist mucous membranes NECK: Supple, normal ROM CARD: RRR; S1 and S2 appreciated RESP:  Normal chest excursion without splinting or tachypnea; breath sounds clear and equal bilaterally; no wheezes, no rhonchi, no rales, no hypoxia or respiratory distress, speaking full sentences ABD/GI: Non-distended; soft, tender in the left lower abdomen, no guarding or rebound, nonperitoneal BACK: The back appears normal EXT: Normal ROM in all joints; no deformity noted, no edema SKIN: Normal color for age and race; warm; no rash on exposed skin NEURO: Moves all extremities equally, normal speech PSYCH: The patient's mood and manner are appropriate.   ED Results / Procedures / Treatments   LABS: (all labs ordered are listed, but only abnormal results are displayed) Labs Reviewed  COMPREHENSIVE METABOLIC PANEL WITH GFR - Abnormal; Notable for the following components:      Result Value   Sodium 133 (*)    CO2 20 (*)     Glucose, Bld 108 (*)    Calcium 8.7 (*)    Total Protein 6.4 (*)    All other components within normal limits  CBC - Abnormal; Notable for the following components:   MCV 104.8 (*)    MCH 34.9 (*)    All other components within normal limits  URINALYSIS, ROUTINE W REFLEX MICROSCOPIC - Abnormal; Notable for the following components:   Color, Urine YELLOW (*)    APPearance TURBID (*)    Hgb urine dipstick MODERATE (*)    Bacteria, UA RARE (*)    All other components within normal limits  HCG, QUANTITATIVE, PREGNANCY - Abnormal; Notable for the following components:   hCG, Beta Chain, Quant, S 507 (*)    All other components within normal limits  LIPASE, BLOOD  TYPE AND SCREEN     EKG:  EKG Interpretation Date/Time:    Ventricular Rate:    PR Interval:    QRS Duration:    QT Interval:    QTC Calculation:   R Axis:      Text Interpretation:           RADIOLOGY: My personal review and interpretation of imaging: Ultrasound shows left ectopic.  No sign of rupture.  I have personally reviewed all radiology reports.   US  OB LESS THAN 14 WEEKS W/ OB TRANSVAGINAL AND DOPPLER Result Date: 04/25/2024 CLINICAL DATA:  Pelvic pain with known positive pregnancy test EXAM: OBSTETRIC <14 WK US  AND TRANSVAGINAL OB US  DOPPLER ULTRASOUND OF OVARIES TECHNIQUE: Both transabdominal and transvaginal ultrasound examinations were performed for complete evaluation of the gestation as well as the maternal uterus, adnexal regions, and pelvic cul-de-sac. Transvaginal technique was performed to assess early pregnancy. Color and duplex Doppler ultrasound was utilized to evaluate blood flow to the ovaries. COMPARISON:  04/08/2024 FINDINGS:.: FINDINGS:. Intrauterine gestational sac: Absent Maternal uterus/adnexae: Uterus appears within normal limits. Ovaries are within normal limits. Adjacent to the left ovary however there is a 1.5 x 1.8 cm solid appearing lesion without significant increased  vascularity. Given the lack of intrauterine gestational sac the possibility of ectopic deserves consideration. Moderate free fluid is noted within the pelvic cul-de-sac. Pulsed Doppler evaluation of both ovaries demonstrates normal appearing low-resistance arterial and venous waveforms. IMPRESSION: No evidence of intrauterine gestational sac. Solid appearing lesion adjacent to the left ovary. By history the patient has recently received methotrexate  therapy. This may represent the residual from ectopic pregnancy. Correlate with beta HCG level. Follow-up can be performed as clinically indicated. Electronically Signed   By: Violeta Grey M.D.   On: 04/25/2024 04:05     PROCEDURES:  Critical Care performed: Yes, see critical care  procedure note(s)   CRITICAL CARE Performed by: Verneda Golder   Total critical care time: 30 minutes  Critical care time was exclusive of separately billable procedures and treating other patients.  Critical care was necessary to treat or prevent imminent or life-threatening deterioration.  Critical care was time spent personally by me on the following activities: development of treatment plan with patient and/or surrogate as well as nursing, discussions with consultants, evaluation of patient's response to treatment, examination of patient, obtaining history from patient or surrogate, ordering and performing treatments and interventions, ordering and review of laboratory studies, ordering and review of radiographic studies, pulse oximetry and re-evaluation of patient's condition.   Aaron Aas1-3 Lead EKG Interpretation  Performed by: Briani Maul, Clover Dao, DO Authorized by: Suleyman Ehrman, Clover Dao, DO     Interpretation: normal     ECG rate:  79   ECG rate assessment: normal     Rhythm: sinus rhythm     Ectopy: none     Conduction: normal       IMPRESSION / MDM / ASSESSMENT AND PLAN / ED COURSE  I reviewed the triage vital signs and the nursing notes.    Patient here with  left-sided abdominal pain.  Being treated for potential ectopic pregnancy with IM methotrexate .  The patient is on the cardiac monitor to evaluate for evidence of arrhythmia and/or significant heart rate changes.   DIFFERENTIAL DIAGNOSIS (includes but not limited to):   Ectopic pregnancy, rupture, ovarian torsion, ovarian cyst, doubt appendicitis, diverticulitis, bowel perforation, UTI   Patient's presentation is most consistent with acute presentation with potential threat to life or bodily function.   PLAN: Will obtain labs, urine, pelvic ultrasound with Doppler.  Will give pain medication.  Patient is very anxious about narcotics given oxycodone  at home made her vomit.  Will give antiemetics with the pain medicine here.   MEDICATIONS GIVEN IN ED: Medications  sodium chloride  0.9 % bolus 1,000 mL (0 mLs Intravenous Stopped 04/25/24 0500)  morphine (PF) 2 MG/ML injection 2 mg (2 mg Intravenous Given 04/25/24 0428)  ondansetron (ZOFRAN) injection 4 mg (4 mg Intravenous Given 04/25/24 0429)     ED COURSE: Patient's hemoglobin is normal at 13.8.  Urine shows no sign of infection.  hCG is downtrending from 556 2 days ago to 507.  Patient is Rh+ and does not need RhoGAM.  Ultrasound reviewed and interpreted by myself and the radiologist and shows left ectopic pregnancy.  No sign of rupture.  Will discuss with OB/GYN on-call for further recommendations.   Discussed with Quince Bryant, CNM with OB/GYN.  Appreciate her help.  She did discuss the case with Dr. Dell Fennel who is familiar with this patient.  At this time given patient's pain is well-controlled, no signs of rupture on ultrasound or clinically on exam, nonperitoneal abdomen, patient being hemodynamically stable with normal hemoglobin, no indication to take her to the operating room.  We did discuss that patient could be now having pain because of methotrexate  starting to work and have recommended close follow-up with her OB/GYN.  She agrees and  would like to preserve her fallopian tubes as to preserve fertility for future pregnancies.  We did discuss pain and bleeding return precautions.  Patient and husband are comfortable with this plan.  Will provide with prescription for hydrocodone  as well as Zofran for home.  Her OB/GYN has recommended avoiding NSAIDs at this time.  She has outpatient follow-up scheduled.   At this time, I do not feel there is  any life-threatening condition present. I reviewed all nursing notes, vitals, pertinent previous records.  All lab and urine results, EKGs, imaging ordered have been independently reviewed and interpreted by myself.  I reviewed all available radiology reports from any imaging ordered this visit.  Based on my assessment, I feel the patient is safe to be discharged home without further emergent workup and can continue workup as an outpatient as needed. Discussed all findings, treatment plan as well as usual and customary return precautions.  They verbalize understanding and are comfortable with this plan.  Outpatient follow-up has been provided as needed.  All questions have been answered.    CONSULTS: OB/GYN consulted.   OUTSIDE RECORDS REVIEWED: Reviewed recent OB/GYN notes.       FINAL CLINICAL IMPRESSION(S) / ED DIAGNOSES   Final diagnoses:  Left tubal pregnancy without intrauterine pregnancy     Rx / DC Orders   ED Discharge Orders          Ordered    HYDROcodone -acetaminophen  (NORCO/VICODIN) 5-325 MG tablet  Every 6 hours PRN        04/25/24 0514    ondansetron (ZOFRAN-ODT) 4 MG disintegrating tablet  Every 6 hours PRN        04/25/24 0514             Note:  This document was prepared using Dragon voice recognition software and may include unintentional dictation errors.   Tanish Sinkler, Clover Dao, DO 04/25/24 [phone]

## 2024-04-26 ENCOUNTER — Other Ambulatory Visit: Payer: Self-pay

## 2024-04-26 ENCOUNTER — Other Ambulatory Visit

## 2024-04-26 DIAGNOSIS — O039 Complete or unspecified spontaneous abortion without complication: Secondary | ICD-10-CM

## 2024-04-27 ENCOUNTER — Encounter

## 2024-04-27 ENCOUNTER — Other Ambulatory Visit: Payer: Self-pay

## 2024-04-27 ENCOUNTER — Telehealth: Payer: Self-pay

## 2024-04-27 ENCOUNTER — Telehealth: Payer: Self-pay | Admitting: Obstetrics

## 2024-04-27 ENCOUNTER — Ambulatory Visit: Payer: Self-pay

## 2024-04-27 DIAGNOSIS — O039 Complete or unspecified spontaneous abortion without complication: Secondary | ICD-10-CM

## 2024-04-27 LAB — BETA HCG QUANT (REF LAB): hCG Quant: 548 m[IU]/mL

## 2024-04-27 NOTE — Telephone Encounter (Signed)
 FYI Only or Action Required?: Action required by provider  Patient was last seen in primary care on 04/23/2024 by Hadassah Letters, MD. Called Nurse Triage reporting Ectopic Pregnancy and Anxiety. Symptoms began several days ago. Interventions attempted: Prescription medications: methotrexate  injection administered by OBGYN. Symptoms are: anxiety, left lower abdominal pain and lower abdominal pressure unchanged.  Triage Disposition: Call PCP When Office is Open- patient has appt with OBGYN on Thursday and advised she call OBGYN today  Patient/caregiver understands and will follow disposition?: Yes                          Copied from CRM [id number]. Topic: Clinical - Red Word Triage >> Apr 27, 2024  9:43 AM Melanie [name] wrote: Red Word that prompted transfer to Nurse Triage: Patient is calling in regard to ectopic pregnancy, she has a virtual visit scheduled but feels she needs care sooner. She is havng extreme anxiety and pain. Reason for Disposition  Caller requesting a CONTROLLED substance prescription refill (e.g., narcotics, ADHD medicines)  Answer Assessment - Initial Assessment Questions 1. DRUG NAME: "What medicine do you need to have refilled?"     Alprazolam; Hydrocodone -acetaminophen .  2. REFILLS REMAINING: "How many refills are remaining?" (Note: The label on the medicine or pill bottle will show how many refills are remaining. If there are no refills remaining, then a renewal may be needed.)     Zero.  3. EXPIRATION DATE: "What is the expiration date?" (Note: The label states when the prescription will expire, and thus can no longer be refilled.)     N/A.  4. PRESCRIBING HCP: "Who prescribed it?" Reason: If prescribed by specialist, call should be referred to that group.     She states Dr Juliette Oh has not prescribed these medications before.  5. SYMPTOMS: "Do you have any symptoms?"     Anxiety, left lower abdominal pain (lower abdominal pressure)  6.  PREGNANCY: "Is there any chance that you are pregnant?" "When was your last menstrual period?"     Yes, ectopic pregnancy diagnosed last week.  Patient states [facility] pharmacy would not give her the clonazepam  that was prescribed by Dr Juliette Oh "because I used to be on alprazolam so they won't let me take the clonazepam ". She also states the ED sent over hydrocodone  but [facility] states they did not receive it per chart it states e prescription transmission to pharmacy failed for hydrocodone .  Protocols used: Medication Refill and Renewal Call-A-AH

## 2024-04-27 NOTE — Telephone Encounter (Signed)
 Hcg is going up still, pt wants to know what's next since they are not going down. Pt had methotrexate  friday

## 2024-04-27 NOTE — Telephone Encounter (Signed)
 Pt aware that on 6/6 her hcg was 556 and on 6/9 548, she's aware that the methotrexate  will make her feel uncomfortable and to look for heavy excessive bleeding filling a large pad front to back with in a hour, she scheduled her next blood work on 6/12. She understands that there is nothing to do now except wait and we will see what the next levels are.

## 2024-04-27 NOTE — Telephone Encounter (Signed)
 Patient call this morning.  She would like a call back from you.  She is concerned because her levels are going back up.  She said she got a shot at [facility], and still having pain on the left side and lower abdomen. She is beginning to have anxiety because she doesn't know what to expect.  She doesn't know if this is suppose to be happening.  Again she would like for you to give her a call.  Sincerely, Melanie [name]

## 2024-04-29 ENCOUNTER — Ambulatory Visit: Payer: Self-pay | Admitting: Obstetrics

## 2024-04-29 ENCOUNTER — Telehealth: Admitting: Pediatrics

## 2024-04-29 ENCOUNTER — Other Ambulatory Visit: Payer: Self-pay | Admitting: Obstetrics

## 2024-04-29 ENCOUNTER — Other Ambulatory Visit

## 2024-04-29 ENCOUNTER — Encounter: Payer: Self-pay | Admitting: Pediatrics

## 2024-04-29 DIAGNOSIS — O00109 Unspecified tubal pregnancy without intrauterine pregnancy: Secondary | ICD-10-CM

## 2024-04-29 DIAGNOSIS — F410 Panic disorder [episodic paroxysmal anxiety] without agoraphobia: Secondary | ICD-10-CM | POA: Diagnosis not present

## 2024-04-29 DIAGNOSIS — O039 Complete or unspecified spontaneous abortion without complication: Secondary | ICD-10-CM

## 2024-04-29 DIAGNOSIS — Z3A01 Less than 8 weeks gestation of pregnancy: Secondary | ICD-10-CM

## 2024-04-29 LAB — BETA HCG QUANT (REF LAB): hCG Quant: 469 m[IU]/mL

## 2024-04-29 MED ORDER — HYDROCODONE-ACETAMINOPHEN 5-325 MG PO TABS: 1.0000 | ORAL_TABLET | Freq: Three times a day (TID) | ORAL | 0 refills | Status: AC | PRN

## 2024-04-29 NOTE — Assessment & Plan Note (Addendum)
 Slight increase in hCG not concerning unless significant rise. OB does not recommend repeat ultrasound, so defer to their recommendation. Decreased pain levels but still having severe breakthrough. Intolerant to oxycodone , tolerates hydrocodone . - Prescribe hydrocodone  (Norco) for pain management. - Repeat blood work to monitor hCG levels. - Confirm with OB if repeat ultrasound is necessary.

## 2024-04-29 NOTE — Progress Notes (Signed)
 Offered patient Virtual Visit this morning after her labwork, per front desk, she declined.  Phone call to patient to discuss results and next steps. No answer, left voicemail.   Pt's course so far:  04/23/24 Day 1: hCG 556; s/p Methotrexate  dose #1 04/26/24 Day 4: hCG 548 04/29/24 Day 7: hCG 469; this is <15% decrease from days 4-7; (15% decrease would be 465 or less)  Patient needs 2nd dose of Methotrexate : -Appt made for 04/30/24 at 8:30a at [facility] same-day surgery -3 attempted phone calls and VM left with patient re: this appointment -Orders signed for labs and Methotrexate  dose #2, RN to release -CMA to call again first thing morning of 04/30/24; can adjust appt time for 2nd dose if needed.   Repeat hCG on: 05/06/24 Day 14 (has signed order)  If <15% decrease between day 7 and 14, give additional dose of Methotrexate .  If >15% decrease between day 7 and 14, check hCG weekly until undetectable.  If 3 doses of Methotrexate  have been given and there is a < 15% decrease from day 21 to 28, pt will need surgery.

## 2024-04-29 NOTE — Progress Notes (Signed)
 Telehealth Visit  I connected with  Melanie [name] on 04/29/24 by a video enabled telemedicine application and verified that I am speaking with the correct person using two identifiers.   I discussed the limitations of evaluation and management by telemedicine. The patient expressed understanding and agreed to proceed.  Subjective:    Patient ID: Melanie [name], female    DOB: January 22, 1991, 33 y.o.   MRN: [id number]  HPI: Melanie [name] is a 33 y.o. female  Chief Complaint  Patient presents with   Follow-up    Discussed the use of AI scribe software for clinical note transcription with the patient, who gave verbal consent to proceed.  History of Present Illness   Melanie [name] is a 33 year old female who presents for follow-up blood work and pain management.  She is undergoing follow-up blood work at the [facility] department. Her hCG levels have increased slightly from 507 to 540 since her last visit. She is scheduled for repeat blood work today to monitor the levels.  She experiences pain, which has been decreasing but remains noticeable. She initially took oxycodone , which caused severe nausea and vomiting, and clarified that hydrocodone  is the medication she tolerates better.  There is an issue with her medication at the pharmacy. She has been on alprazolam before, which has caused confusion with her current prescription for Klonopin . The pharmacy has not filled her prescription for alprazolam due to this history, and she is considering a short course of alprazolam to manage her symptoms.  She has anxiety and trauma related to her current medical situation and is eager for resolution. She inquires about the possibility of an ultrasound to ensure everything is progressing as expected, although she has not yet seen her OB doctor for a follow-up appointment.      Relevant past medical, surgical, family and social history reviewed and updated as indicated. Interim medical history since our last visit  reviewed. Allergies and medications reviewed and updated.  ROS per HPI unless specifically indicated above     Objective:    LMP 02/22/2024 (Approximate)   Wt Readings from Last 3 Encounters:  04/25/24 100 lb (45.4 kg)  04/23/24 99 lb (44.9 kg)  04/15/24 102 lb (46.3 kg)     Physical Exam Constitutional:      General: She is not in acute distress.    Appearance: Normal appearance.   Neurological:     General: No focal deficit present.     Mental Status: She is alert. Mental status is at baseline.      LIMITED EXAM GIVEN VIDEO VISIT     Assessment & Plan:  Assessment & Plan   Tubal pregnancy without intrauterine pregnancy, unspecified laterality Assessment & Plan: Slight increase in hCG not concerning unless significant rise. OB does not recommend repeat ultrasound, so defer to their recommendation. Decreased pain levels but still having severe breakthrough. Intolerant to oxycodone , tolerates hydrocodone . - Prescribe hydrocodone  (Norco) for pain management. - Repeat blood work to monitor hCG levels. - Confirm with OB if repeat ultrasound is necessary.  Orders: -     HYDROcodone -Acetaminophen ; Take 1 tablet by mouth every 8 (eight) hours as needed for up to 3 days for severe pain (pain score 7-10) (breakthrough pain only).  Dispense: 9 tablet; Refill: 0  Panic disorder Assessment & Plan: Anxiety related to medical situation. Pharmacy issues with Klonopin  due to previous alprazolam use.  Recommend she requests refills from psychiatry.      Follow up plan: Return in about  2 weeks (around 05/13/2024) for follow up.  Hadassah Letters, MD   This visit was completed via video visit through MyChart due to the restrictions of the COVID-19 pandemic. All issues as above were discussed and addressed. Physical exam was done as above through visual confirmation on video through MyChart. If it was felt that the patient should be evaluated in the office, they were directed there.  The patient verbally consented to this visit.} Location of the patient: parking lot Location of the provider: home Those involved with this call:  Provider: Geraldine Kling, MD CMA: Mancil Seat, CMA Time spent on call: 15 minutes with patient face to face via video conference. More than 50% of this time was spent in counseling and coordination of care. 15 minutes total spent in review of patient's record and preparation of their chart. Total time spent on this encounter: 30 minutes.

## 2024-04-29 NOTE — Assessment & Plan Note (Signed)
 Anxiety related to medical situation. Pharmacy issues with Klonopin  due to previous alprazolam use.  Recommend she requests refills from psychiatry.

## 2024-04-29 NOTE — Telephone Encounter (Signed)
 Patient had appointment this morning 6/12 discussed with provider, verbally ok'd to close per provider

## 2024-04-30 ENCOUNTER — Other Ambulatory Visit (HOSPITAL_COMMUNITY): Payer: Self-pay

## 2024-04-30 ENCOUNTER — Ambulatory Visit
Admission: RE | Admit: 2024-04-30 | Discharge: 2024-04-30 | Disposition: A | Discharge status: Home / Self Care | Source: Ambulatory Visit | Attending: Pediatrics | Admitting: Pediatrics

## 2024-04-30 DIAGNOSIS — O00109 Unspecified tubal pregnancy without intrauterine pregnancy: Secondary | ICD-10-CM | POA: Diagnosis present

## 2024-04-30 DIAGNOSIS — Z3A00 Weeks of gestation of pregnancy not specified: Secondary | ICD-10-CM | POA: Diagnosis not present

## 2024-04-30 LAB — COMPREHENSIVE METABOLIC PANEL WITH GFR
ALT: 17 U/L (ref 0–44)
AST: 17 U/L (ref 15–41)
Albumin: 3.9 g/dL (ref 3.5–5.0)
Alkaline Phosphatase: 53 U/L (ref 38–126)
Anion gap: 9 (ref 5–15)
BUN: 6 mg/dL (ref 6–20)
CO2: 23 mmol/L (ref 22–32)
Calcium: 8.9 mg/dL (ref 8.9–10.3)
Chloride: 106 mmol/L (ref 98–111)
Creatinine, Ser: 0.67 mg/dL (ref 0.44–1.00)
GFR, Estimated: >60 mL/min (ref >60)
Glucose, Bld: 98 mg/dL (ref 70–99)
Potassium: 3.5 mmol/L (ref 3.5–5.1)
Sodium: 138 mmol/L (ref 135–145)
Total Bilirubin: 1 mg/dL (ref 0.0–1.2)
Total Protein: 6.7 g/dL (ref 6.5–8.1)

## 2024-04-30 LAB — CBC WITH DIFFERENTIAL/PLATELET
Abs Immature Granulocytes: 0.01 10*3/uL (ref 0.00–0.07)
Basophils Absolute: 0.1 10*3/uL (ref 0.0–0.1)
Basophils Relative: 1 %
Eosinophils Absolute: 0.2 10*3/uL (ref 0.0–0.5)
Eosinophils Relative: 3 %
HCT: 41.8 % (ref 36.0–46.0)
Hemoglobin: 14.3 g/dL (ref 12.0–15.0)
Immature Granulocytes: 0 %
Lymphocytes Relative: 40 %
Lymphs Abs: 2.4 10*3/uL (ref 0.7–4.0)
MCH: 34.9 pg — ABNORMAL HIGH (ref 26.0–34.0)
MCHC: 34.2 g/dL (ref 30.0–36.0)
MCV: 102 fL — ABNORMAL HIGH (ref 80.0–100.0)
Monocytes Absolute: 0.4 10*3/uL (ref 0.1–1.0)
Monocytes Relative: 7 %
Neutro Abs: 3 10*3/uL (ref 1.7–7.7)
Neutrophils Relative %: 49 %
Platelets: 184 10*3/uL (ref 150–400)
RBC: 4.1 MIL/uL (ref 3.87–5.11)
RDW: 13.1 % (ref 11.5–15.5)
WBC: 6 10*3/uL (ref 4.0–10.5)
nRBC: 0 % (ref 0.0–0.2)

## 2024-04-30 LAB — BETA HCG QUANT (REF LAB)

## 2024-04-30 MED ORDER — METHOTREXATE FOR ECTOPIC PREGNANCY
50.0000 mg/m2 | Freq: Once | INTRAMUSCULAR | Status: AC
  Administered 2024-04-30: 72.5 mg via INTRAMUSCULAR
  Filled 2024-04-30: qty 2.9

## 2024-04-30 NOTE — Telephone Encounter (Signed)
 Called pt to discuss lab results. Reviewed CBC and CMP results. She denies any questions or concerns at this time. She received her second dose of methotrexate  this morning. Reminder her of appointment on 6/19 for repeat beta quant level. She verbalizes understanding and agrees.   Follow up a as scheduled .   Melanie [name], CNM

## 2024-05-03 NOTE — Telephone Encounter (Signed)
 Spoke with patient. Advised cancelled result was likely a duplicate order. 04/29/24 results (469) were already reviewed/discussed with patient by Dr. Dell Fennel There are no new results. Next expected result will be 6/19. Patient verbalized understanding.

## 2024-05-03 NOTE — Telephone Encounter (Signed)
 Melanie [name]

## 2024-05-04 ENCOUNTER — Encounter

## 2024-05-06 ENCOUNTER — Other Ambulatory Visit

## 2024-05-06 ENCOUNTER — Other Ambulatory Visit: Payer: Self-pay | Admitting: Certified Nurse Midwife

## 2024-05-06 DIAGNOSIS — O00109 Unspecified tubal pregnancy without intrauterine pregnancy: Secondary | ICD-10-CM

## 2024-05-06 DIAGNOSIS — O039 Complete or unspecified spontaneous abortion without complication: Secondary | ICD-10-CM

## 2024-05-06 LAB — BETA HCG QUANT (REF LAB): hCG Quant: 204 m[IU]/mL

## 2024-05-06 NOTE — Telephone Encounter (Signed)
 Patient called with lab results. Discussed follow up Beta next week in the office. She verbalizes and agrees to plan.  Alise Appl, CNM

## 2024-05-12 ENCOUNTER — Telehealth: Payer: Self-pay

## 2024-05-12 NOTE — Telephone Encounter (Signed)
 Pt calling triage wanting to know if its okay to start her OTC iron? Last Friday was her last Methotrexate  injection.

## 2024-05-13 ENCOUNTER — Other Ambulatory Visit

## 2024-05-13 DIAGNOSIS — O039 Complete or unspecified spontaneous abortion without complication: Secondary | ICD-10-CM

## 2024-05-14 ENCOUNTER — Encounter: Payer: Self-pay | Admitting: Obstetrics

## 2024-05-14 LAB — BETA HCG QUANT (REF LAB): hCG Quant: 25 m[IU]/mL

## 2024-05-15 ENCOUNTER — Other Ambulatory Visit: Payer: Self-pay | Admitting: Certified Nurse Midwife

## 2024-05-15 ENCOUNTER — Encounter: Payer: Self-pay | Admitting: Certified Nurse Midwife

## 2024-05-15 DIAGNOSIS — O039 Complete or unspecified spontaneous abortion without complication: Secondary | ICD-10-CM

## 2024-05-17 ENCOUNTER — Telehealth: Payer: Self-pay | Admitting: Pediatrics

## 2024-05-17 ENCOUNTER — Other Ambulatory Visit: Payer: Self-pay

## 2024-05-17 ENCOUNTER — Encounter: Admitting: Certified Nurse Midwife

## 2024-05-17 DIAGNOSIS — O039 Complete or unspecified spontaneous abortion without complication: Secondary | ICD-10-CM

## 2024-05-17 NOTE — Telephone Encounter (Signed)
 Copied from CRM [id number]. Topic: Appointments - Scheduling Inquiry for Clinic >> May 17, 2024 11:03 AM Selinda RAMAN wrote: Reason for CRM: The patient called in requesting for her provider to do blood work when she comes in tomorrow. I added that to her appointment notes.

## 2024-05-17 NOTE — Addendum Note (Signed)
 Addended by: DONELDA BURNARD CROME on: 05/17/2024 01:25 PM   Modules accepted: Orders

## 2024-05-18 ENCOUNTER — Encounter: Payer: Self-pay | Admitting: Pediatrics

## 2024-05-18 ENCOUNTER — Ambulatory Visit: Admitting: Pediatrics

## 2024-05-18 VITALS — BP 100/64 | HR 63 | Temp 97.9°F | Wt 98.4 lb

## 2024-05-18 DIAGNOSIS — O039 Complete or unspecified spontaneous abortion without complication: Secondary | ICD-10-CM

## 2024-05-18 DIAGNOSIS — F410 Panic disorder [episodic paroxysmal anxiety] without agoraphobia: Secondary | ICD-10-CM

## 2024-05-18 DIAGNOSIS — Z3A01 Less than 8 weeks gestation of pregnancy: Secondary | ICD-10-CM

## 2024-05-18 DIAGNOSIS — O00109 Unspecified tubal pregnancy without intrauterine pregnancy: Secondary | ICD-10-CM | POA: Diagnosis not present

## 2024-05-18 DIAGNOSIS — I959 Hypotension, unspecified: Secondary | ICD-10-CM

## 2024-05-18 DIAGNOSIS — R58 Hemorrhage, not elsewhere classified: Secondary | ICD-10-CM

## 2024-05-18 NOTE — Progress Notes (Signed)
 Office Visit  BP 100/64   Pulse 63   Temp 97.9 F (36.6 C) (Oral)   Wt 98 lb 6.4 oz (44.6 kg)   LMP  (LMP Unknown)   SpO2 99%   BMI 16.89 kg/m    Subjective:    Patient ID: Melanie [name], female    DOB: 02/03/1991, 33 y.o.   MRN: [id number]  HPI: Melanie [name] is a 33 y.o. female  Chief Complaint  Patient presents with   Follow-up    Patient is requesting blood work to check levels     Discussed the use of AI scribe software for clinical note transcription with the patient, who gave verbal consent to proceed.  History of Present Illness   Melanie [name] is a 33 year old female who presents with concerns about her blood levels following a recent miscarriage.  She is experiencing significant anxiety regarding her current blood levels after a recent miscarriage. Her OB has scheduled blood work for a week and a half later, which is causing her distress. She is eager to know her current levels to determine if she can return to her normal activities, including work and spending time with her husband.  She describes a recent episode of heavy bleeding that occurred last Thursday or Friday, which has since stopped. Her last known blood level was 25, and she is anxious to see if it has decreased further. She wants to confirm that her blood levels have decreased so she can resume her normal life.  She has been [name] tired, with fluctuating energy levels, and has recently restarted taking iron supplements three days ago due to significant blood loss. She is concerned about her iron levels and is interested in having them checked.  She recalls a recent blood pressure reading at a regional facility that was 'ninety something' for the systolic value. She experiences occasional lightheadedness and dizziness but has not felt like passing out. Her appetite is gradually returning. No lightheadedness or dizziness to the point of passing out.      Relevant past medical, surgical, family and social history  reviewed and updated as indicated. Interim medical history since our last visit reviewed. Allergies and medications reviewed and updated.  ROS per HPI unless specifically indicated above     Objective:    BP 100/64   Pulse 63   Temp 97.9 F (36.6 C) (Oral)   Wt 98 lb 6.4 oz (44.6 kg)   LMP  (LMP Unknown)   SpO2 99%   BMI 16.89 kg/m   Wt Readings from Last 3 Encounters:  05/18/24 98 lb 6.4 oz (44.6 kg)  04/25/24 100 lb (45.4 kg)  04/23/24 99 lb (44.9 kg)     Physical Exam      05/18/2024    9:45 AM 04/29/2024    8:23 AM 04/07/2024   11:36 AM 04/01/2024    1:33 PM 02/09/2024    1:16 PM  Depression screen PHQ 2/9  Decreased Interest 2 3 1 3  0  Down, Depressed, Hopeless 2 3 1 2  0  PHQ - 2 Score 4 6 2 5  0  Altered sleeping 2 3 1  0   Tired, decreased energy 2 3 1 1    Change in appetite 2 3 1  0   [name] bad or failure about yourself  2 3 1  0   Trouble concentrating 2 3 1  0   Moving slowly or fidgety/restless 2 3 1  0   Suicidal thoughts 2 3 1  0   PHQ-9  Score 18 27 9 6    Difficult doing work/chores Very difficult Very difficult Somewhat difficult Somewhat difficult        05/18/2024    9:45 AM 04/29/2024    8:24 AM 04/07/2024   11:37 AM 04/01/2024    1:33 PM  GAD 7 : Generalized Anxiety Score  Nervous, Anxious, on Edge 3 3 1 1   Control/stop worrying 3 3 3 1   Worry too much - different things 3 3 3 1   Trouble relaxing 3 3 3 1   Restless 3 3 2 1   Easily annoyed or irritable 3 3 3 1   Afraid - awful might happen 3 3 3 1   Total GAD 7 Score 21 21 18 7   Anxiety Difficulty Very difficult Very difficult Very difficult Somewhat difficult       Assessment & Plan:  Assessment & Plan   Tubal pregnancy without intrauterine pregnancy, unspecified laterality Miscarriage Beta-hCG levels decreased from 700 to 25, indicating resolution. She expressed anxiety about management and potential need for further intervention. She doesn't want to wait for next OB visit, understands if still  above 5 may need repeat monitoring. - Order beta-hCG level to assess current status. - Advise to avoid heavy lifting and physical activities until levels are confirmed low. - Discussed importance of follow-up with OB on Thursday. -     Beta hCG quant (ref lab)  Hypotension, unspecified hypotension type Bleeding Intermittent lightheadedness and fatigue with low systolic blood pressure in the 90s. Fatigue and significant blood loss suggest anemia. Iron supplementation restarted three days ago In setting of miscarriage as discussed above. Will check blood work below. - Advise to report syncope or worsening symptoms.  -     CBC with Differential/Platelet -     Iron, TIBC and Ferritin Panel -     Vitamin B12 -     Folate -     Basic metabolic panel with GFR  Panic disorder Exacerbated due to above, pending TSH check will add to blood work today. -     TSH  Follow up plan: Return if symptoms worsen or fail to improve.  Hadassah SHAUNNA Nett, MD

## 2024-05-19 ENCOUNTER — Telehealth: Admitting: Obstetrics

## 2024-05-19 ENCOUNTER — Encounter: Payer: Self-pay | Admitting: Obstetrics

## 2024-05-19 ENCOUNTER — Ambulatory Visit: Payer: Self-pay | Admitting: Pediatrics

## 2024-05-19 ENCOUNTER — Telehealth: Payer: Self-pay | Admitting: Obstetrics

## 2024-05-19 DIAGNOSIS — Z09 Encounter for follow-up examination after completed treatment for conditions other than malignant neoplasm: Secondary | ICD-10-CM | POA: Diagnosis not present

## 2024-05-19 DIAGNOSIS — O00109 Unspecified tubal pregnancy without intrauterine pregnancy: Secondary | ICD-10-CM

## 2024-05-19 DIAGNOSIS — Z30011 Encounter for initial prescription of contraceptive pills: Secondary | ICD-10-CM | POA: Diagnosis not present

## 2024-05-19 LAB — BASIC METABOLIC PANEL WITH GFR
BUN/Creatinine Ratio: 8 — ABNORMAL LOW (ref 9–23)
BUN: 6 mg/dL (ref 6–20)
CO2: 19 mmol/L — ABNORMAL LOW (ref 20–29)
Calcium: 9 mg/dL (ref 8.7–10.2)
Chloride: 105 mmol/L (ref 96–106)
Creatinine, Ser: 0.73 mg/dL (ref 0.57–1.00)
Glucose: 71 mg/dL (ref 70–99)
Potassium: 3.8 mmol/L (ref 3.5–5.2)
Sodium: 143 mmol/L (ref 134–144)
eGFR: 111 mL/min/{1.73_m2} (ref >59)

## 2024-05-19 LAB — CBC WITH DIFFERENTIAL/PLATELET
Basophils Absolute: 0.1 10*3/uL (ref 0.0–0.2)
Basos: 1 %
EOS (ABSOLUTE): 0.2 10*3/uL (ref 0.0–0.4)
Eos: 3 %
Hematocrit: 44.8 % (ref 34.0–46.6)
Hemoglobin: 14.8 g/dL (ref 11.1–15.9)
Immature Grans (Abs): 0 10*3/uL (ref 0.0–0.1)
Immature Granulocytes: 0 %
Lymphocytes Absolute: 2.3 10*3/uL (ref 0.7–3.1)
Lymphs: 40 %
MCH: 35.4 pg — ABNORMAL HIGH (ref 26.6–33.0)
MCHC: 33 g/dL (ref 31.5–35.7)
MCV: 107 fL — ABNORMAL HIGH (ref 79–97)
Monocytes Absolute: 0.4 10*3/uL (ref 0.1–0.9)
Monocytes: 8 %
Neutrophils Absolute: 2.8 10*3/uL (ref 1.4–7.0)
Neutrophils: 48 %
Platelets: 205 10*3/uL (ref 150–450)
RBC: 4.18 x10E6/uL (ref 3.77–5.28)
RDW: 12.6 % (ref 11.7–15.4)
WBC: 5.8 10*3/uL (ref 3.4–10.8)

## 2024-05-19 LAB — VITAMIN B12: Vitamin B-12: >2000 pg/mL — ABNORMAL HIGH (ref 232–1245)

## 2024-05-19 LAB — IRON,TIBC AND FERRITIN PANEL
Ferritin: 55 ng/mL (ref 15–150)
Iron Saturation: >93 % (ref 15–55)
Iron: 237 ug/dL — ABNORMAL HIGH (ref 27–159)
Total Iron Binding Capacity: <254 ug/dL (ref 250–450)
UIBC: <17 ug/dL — ABNORMAL LOW (ref 131–425)

## 2024-05-19 LAB — TSH: TSH: 1.19 u[IU]/mL (ref 0.450–4.500)

## 2024-05-19 LAB — BETA HCG QUANT (REF LAB): hCG Quant: 3 m[IU]/mL

## 2024-05-19 LAB — FOLATE: Folate: 18.7 ng/mL (ref >3.0)

## 2024-05-19 MED ORDER — NORETHINDRONE 0.35 MG PO TABS: 1.0000 | ORAL_TABLET | Freq: Every day | ORAL | 3 refills | Status: DC

## 2024-05-19 NOTE — Progress Notes (Addendum)
    GYNECOLOGY PROGRESS NOTE  Subjective:  PCP: Melanie Hadassah SQUIBB, MD  Patient ID: Melanie [name], female    DOB: 1991/03/17, 33 y.o.   MRN: [id number]  Virtual Visit via Video Note  I connected with Melanie [name] on 05/19/2024 at  11:15 AM EDT by a video enabled telemedicine application and verified that I am speaking with the correct person using two identifiers.  Location: Patient: at home Provider: [facility]   I discussed the limitations of evaluation and management by telemedicine and the availability of in person appointments. The patient expressed understanding and agreed to proceed.  HPI  Patient is a 33 y.o. G52P1011 female who presents for Video visit. Following up on Tubal Pregnancy. She is now s/p Methotrexate  x 2 (04/23/24, 04/30/24) and her most recent hCG was yesterday, 05/18/24 = 3. She has had bleeding over the past weekend, but none for the past few days and denies abd/pelvic pain. Feels ten times better. No longer wants to have another baby, requesting contraception today, does smoke daily. Is also taking daily iron.   The following portions of the patient's history were reviewed and updated as appropriate: allergies, current medications, past family history, past medical history, past social history, past surgical history, and problem list.  Review of Systems Pertinent items are noted in HPI.   Objective:   not currently breastfeeding. There is no height or weight on file to calculate BMI.  General appearance: alert, cooperative, and no distress Respiratory: NWOB, speaking in full sentences  Assessment/Plan:   1. Tubal pregnancy without intrauterine pregnancy, unspecified laterality    33 y.o. G2P1011 with recent ectopic, most likely on the left, now s/p Methotrexate  x 2 and hCG levels back to zero, [name] well. She can go back to normal activity, desires to start POP as contraception due to daily cigarette use. SE, dosing, timing, all reviewed. Follow up 34yr for annual and  refills, sooner prn.   Follow Up Instructions:    I discussed the assessment and treatment plan with the patient. The patient was provided an opportunity to ask questions and all were answered. The patient agreed with the plan and demonstrated an understanding of the instructions.   The patient was advised to call back or seek an in-person evaluation if the symptoms worsen or if the condition fails to improve as anticipated.   I provided 10 minutes of non-face-to-face time during this encounter.  Estil Mangle, DO [facility]

## 2024-05-19 NOTE — Telephone Encounter (Signed)
 Hey,   Patient called and wants a note to return to work.    Please contact patient once this is complete

## 2024-05-20 ENCOUNTER — Other Ambulatory Visit

## 2024-05-20 ENCOUNTER — Encounter: Payer: Self-pay | Admitting: Obstetrics

## 2024-05-25 NOTE — Telephone Encounter (Signed)
 Harlene, Will you please send a mychart note to this patient so she can return to work with no restrictions from Dr. Leigh. Also please call patient when this is complete.  Thanks, [employer]

## 2024-07-06 ENCOUNTER — Other Ambulatory Visit: Payer: Self-pay

## 2024-07-21 ENCOUNTER — Ambulatory Visit: Admitting: Pediatrics

## 2024-07-28 ENCOUNTER — Ambulatory Visit (INDEPENDENT_AMBULATORY_CARE_PROVIDER_SITE_OTHER): Admitting: Pediatrics

## 2024-07-28 VITALS — BP 103/69 | HR 57 | Temp 97.4°F

## 2024-07-28 DIAGNOSIS — M25511 Pain in right shoulder: Secondary | ICD-10-CM | POA: Diagnosis not present

## 2024-07-28 DIAGNOSIS — G5761 Lesion of plantar nerve, right lower limb: Secondary | ICD-10-CM

## 2024-07-28 MED ORDER — MELOXICAM 15 MG PO TABS: 15.0000 mg | ORAL_TABLET | Freq: Every day | ORAL | 0 refills | Status: DC

## 2024-07-28 NOTE — Patient Instructions (Addendum)
 metatarsal pads  [facility] [address] [place],  [place]  [address]

## 2024-08-09 ENCOUNTER — Encounter: Payer: Self-pay | Admitting: Pediatrics

## 2024-08-09 DIAGNOSIS — G5761 Lesion of plantar nerve, right lower limb: Secondary | ICD-10-CM | POA: Insufficient documentation

## 2024-08-09 NOTE — Progress Notes (Signed)
 Office Visit  BP 103/69   Pulse (!) 57   Temp (!) 97.4 F (36.3 C) (Oral)   SpO2 99%    Subjective:    Patient ID: Melanie [name], female    DOB: 09-Jan-1991, 33 y.o.   MRN: [id number]  HPI: Melanie [name] is a 33 y.o. female  Chief Complaint  Patient presents with   Pain    For a couple of weeks no reason    Discussed the use of AI scribe software for clinical note transcription with the patient, who gave verbal consent to proceed.  History of Present Illness   Melanie [name] is a 33 year old female who presents with foot pain and shoulder pain.  She has been experiencing foot pain for the past couple of weeks, which occurs when bending the foot and while pressing on the gas and brake pedals. She has tried taking arthritis medication, Tylenol , and ibuprofen, but none have alleviated the pain. There have been no recent falls or twisting injuries. Occasionally, she notices swelling but no changes in color. The pain is localized to the front of the foot, with no numbness or burning sensations. She mentions not wearing shoes often.  She also reports right shoulder pain, describing it as a sharp, knife-like pain that occurs when sitting or lying on the couch. The pain is localized to the shoulder joint and radiates up her back. She experiences tenderness in the area and sharp pains with certain movements. She has not had any imaging studies done for the shoulder. She has tried Tylenol , ibuprofen, Tiger Balm, and Voltaren gel without relief. No neck pain is associated with the shoulder pain.      Relevant past medical, surgical, family and social history reviewed and updated as indicated. Interim medical history since our last visit reviewed. Allergies and medications reviewed and updated.  ROS per HPI unless specifically indicated above     Objective:    BP 103/69   Pulse (!) 57   Temp (!) 97.4 F (36.3 C) (Oral)   SpO2 99%   Wt Readings from Last 3 Encounters:  05/18/24 98 lb 6.4 oz  (44.6 kg)  04/25/24 100 lb (45.4 kg)  04/23/24 99 lb (44.9 kg)     Physical Exam Constitutional:      Appearance: Normal appearance.  Pulmonary:     Effort: Pulmonary effort is normal.  Musculoskeletal:        General: Normal range of motion.       Feet:  Feet:     Comments: Discomfort as below Neurological:     General: No focal deficit present.     Mental Status: She is alert. Mental status is at baseline.  Psychiatric:        Mood and Affect: Mood normal.        Behavior: Behavior normal.        Thought Content: Thought content normal.         07/28/2024    4:13 PM 05/18/2024    9:45 AM 04/29/2024    8:23 AM 04/07/2024   11:36 AM 04/01/2024    1:33 PM  Depression screen PHQ 2/9  Decreased Interest 1 2 3 1 3   Down, Depressed, Hopeless 1 2 3 1 2   PHQ - 2 Score 2 4 6 2 5   Altered sleeping 1 2 3 1  0  Tired, decreased energy 1 2 3 1 1   Change in appetite 1 2 3 1  0  [name] bad or failure about  yourself  1 2 3 1  0  Trouble concentrating 1 2 3 1  0  Moving slowly or fidgety/restless 1 2 3 1  0  Suicidal thoughts 1 2 3 1  0  PHQ-9 Score 9 18 27 9 6   Difficult doing work/chores Somewhat difficult Very difficult Very difficult Somewhat difficult Somewhat difficult       07/28/2024    4:13 PM 05/18/2024    9:45 AM 04/29/2024    8:24 AM 04/07/2024   11:37 AM  GAD 7 : Generalized Anxiety Score  Nervous, Anxious, on Edge 1 3 3 1   Control/stop worrying 1 3 3 3   Worry too much - different things 1 3 3 3   Trouble relaxing 1 3 3 3   Restless 1 3 3 2   Easily annoyed or irritable 1 3 3 3   Afraid - awful might happen 1 3 3 3   Total GAD 7 Score 7 21 21 18   Anxiety Difficulty Somewhat difficult Very difficult Very difficult Very difficult       Assessment & Plan:  Assessment & Plan   Acute pain of right shoulder Sharp pain exacerbated by movement. Differential includes rotator cuff irritation or bone spur. No prior imaging. - Order x-ray of right shoulder. - Prescribe meloxicam   with similar instructions as for foot pain. - Refer to [facility]. - Schedule follow-up in 4-6 weeks.  -     DG Shoulder Right; Future -     Meloxicam ; Take 1 tablet (15 mg total) by mouth daily.  Dispense: 30 tablet; Refill: 0 -     Ambulatory referral to Physical Therapy  Morton's neuroma of right foot Assessment & Plan: Suspected due to wearing flat shoes. Symptoms include pain with foot movement. No numbness or burning. Swelling occurs occasionally. - Recommend metatarsal foot pads. - Prescribe meloxicam  for 30 days. Avoid ibuprofen; acetaminophen  allowed. - Advise against flat shoes; recommend supportive footwear. - Suggest Morton's neuroma exercises on YouTube. - No imaging required.   Follow up plan: Return in about 4 weeks (around 08/25/2024) for joint pain.  Melanie SHAUNNA Nett, MD

## 2024-08-09 NOTE — Assessment & Plan Note (Signed)
 Suspected due to wearing flat shoes. Symptoms include pain with foot movement. No numbness or burning. Swelling occurs occasionally. - Recommend metatarsal foot pads. - Prescribe meloxicam  for 30 days. Avoid ibuprofen; acetaminophen  allowed. - Advise against flat shoes; recommend supportive footwear. - Suggest Morton's neuroma exercises on YouTube. - No imaging required.

## 2024-08-25 ENCOUNTER — Ambulatory Visit: Admitting: Pediatrics

## 2024-09-09 ENCOUNTER — Ambulatory Visit: Admitting: Student

## 2024-09-09 ENCOUNTER — Encounter: Payer: Self-pay | Admitting: Student

## 2024-09-09 VITALS — BP 100/64 | HR 73 | Temp 97.9°F | Ht 64.0 in | Wt 108.0 lb

## 2024-09-09 DIAGNOSIS — U071 COVID-19: Secondary | ICD-10-CM | POA: Diagnosis not present

## 2024-09-09 DIAGNOSIS — R051 Acute cough: Secondary | ICD-10-CM

## 2024-09-09 LAB — POC COVID19/FLU A&B COMBO
Covid Antigen, POC: POSITIVE — AB
Influenza A Antigen, POC: NEGATIVE
Influenza B Antigen, POC: NEGATIVE

## 2024-09-09 NOTE — Patient Instructions (Signed)
 Your COVID test was positive today.  Please take tylenol  for fever and body aches. Can try guaifenesin for cough and honey/cough drops for sore throat.  Please wear a mask if out in public and stay out of work until you are feeling better and fever free for 48 hours.  Please discuss paxlovid with your primary care if you change your mind.  If having fever that is not improving, significant shortness of breath, inability to eat or drink well please go to the ED.

## 2024-09-09 NOTE — Progress Notes (Unsigned)
 Established Patient Office Visit  Subjective   Patient ID: Melanie [name], female    DOB: 08/30/1991  Age: 33 y.o. MRN: [id number]  Chief Complaint  Patient presents with   Headache    Started having symptoms yesterday, watery eyes, headaches, runny nose, tried OTC medications with no relief    [name] with medical hx listed below presents today for headache and body aches that started yesterday evening after she got home from work. Woke up at night with nasal congestion, cough with white/clear sputum, and sore throat. Has tried OTC cough syrup without improvement. Reports T max at home was 100.0 F yesterday. She works at a [employer] which may have exposed her to fomites but is unaware if anyone is sick. Denies CP, dyspnea, abdominal pain, n/v/d, lightheadedness, or dizziness.  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 2-3x daily   ferrous sulfate 325 (65 FE) MG EC tablet Take 325 mg by mouth.   meloxicam  (MOBIC ) 15 MG tablet Take 1 tablet (15 mg total) by mouth daily.   norethindrone  (MICRONOR ) 0.35 MG tablet Take 1 tablet (0.35 mg total) by mouth daily. (Patient not taking: Reported on 07/28/2024)   PARoxetine (PAXIL) 30 MG tablet Take 30 mg by mouth daily. (Patient not taking: Reported on 07/28/2024)   No facility-administered encounter medications on file as of 09/09/2024.    BP 100/64   Pulse 73   Temp 97.9 F (36.6 C) (Oral)   Ht 5' 4 (1.626 m)    Wt 108 lb (49 kg)   SpO2 96%   BMI 18.54 kg/m  BP Readings from Last 3 Encounters:  09/09/24 100/64  07/28/24 103/69  05/18/24 100/64    Physical Exam Constitutional:      Appearance: Normal appearance.  HENT:     Mouth/Throat:     Mouth: Mucous membranes are moist.     Pharynx: Oropharynx is clear.  Eyes:     Extraocular Movements: Extraocular movements intact.     Pupils: Pupils are equal, round, and reactive to light.  Cardiovascular:     Rate and Rhythm: Normal rate and regular rhythm.  Pulmonary:     Effort: Pulmonary effort is normal. No respiratory distress.     Breath sounds: No rhonchi or rales.     Comments: Intermittent dry cough Abdominal:     General: Abdomen is flat. Bowel sounds are normal. There is no distension.     Palpations: Abdomen is soft.     Tenderness: There is no abdominal tenderness.  Musculoskeletal:        General: Normal range of motion.     Right lower leg: No edema.     Left lower leg: No edema.  Skin:    General: Skin is warm and dry.     Capillary Refill: Capillary refill takes less than 2 seconds.  Neurological:     General: No focal deficit present.  Mental Status: She is alert and oriented to person, place, and time.  Psychiatric:        Mood and Affect: Mood normal.        Behavior: Behavior normal.        07/28/2024    4:13 PM 05/18/2024    9:45 AM 04/29/2024    8:23 AM  Depression screen PHQ 2/9  Decreased Interest 1 2 3   Down, Depressed, Hopeless 1 2 3   PHQ - 2 Score 2 4 6   Altered sleeping 1 2 3   Tired, decreased energy 1 2 3   Change in appetite 1 2 3   Feeling bad or failure about yourself  1 2 3   Trouble concentrating 1 2 3   Moving slowly or fidgety/restless 1 2 3   Suicidal thoughts 1 2 3   PHQ-9 Score 9 18 27   Difficult doing work/chores Somewhat difficult Very difficult Very difficult       07/28/2024    4:13 PM 05/18/2024    9:45 AM 04/29/2024    8:24 AM 04/07/2024   11:37 AM  GAD 7 : Generalized Anxiety  Score  Nervous, Anxious, on Edge 1 3 3 1   Control/stop worrying 1 3 3 3   Worry too much - different things 1 3 3 3   Trouble relaxing 1 3 3 3   Restless 1 3 3 2   Easily annoyed or irritable 1 3 3 3   Afraid - awful might happen 1 3 3 3   Total GAD 7 Score 7 21 21 18   Anxiety Difficulty Somewhat difficult Very difficult Very difficult Very difficult    Results for orders placed or performed in visit on 09/09/24  POC Covid19/Flu A&B Antigen  Result Value Ref Range   Influenza A Antigen, POC Negative Negative   Influenza B Antigen, POC Negative Negative   Covid Antigen, POC Positive (A) Negative      The ASCVD Risk score (Arnett DK, et al., 2019) failed to calculate for the following reasons:   The 2019 ASCVD risk score is only valid for ages 20 to 75    Assessment & Plan:  COVID-19 virus infection Covid test is positive. Has not had COVID vaccine this year. Discussed paxlovid however she would like to hold off. Symptomatic treatment with tylenol , guaifenesin, cough drops/honey. ED precautions reviewed. She will contact PCP office regarding paxlovid is she changes her mind. Work note provided.  -     POC Covid19/Flu A&B Antigen   Return if symptoms worsen or fail to improve.    Harlene Saddler, MD

## 2024-09-29 ENCOUNTER — Encounter: Payer: Self-pay | Admitting: Nurse Practitioner

## 2024-09-29 ENCOUNTER — Telehealth (INDEPENDENT_AMBULATORY_CARE_PROVIDER_SITE_OTHER): Admitting: Nurse Practitioner

## 2024-09-29 ENCOUNTER — Ambulatory Visit: Payer: Self-pay

## 2024-09-29 DIAGNOSIS — M5442 Lumbago with sciatica, left side: Secondary | ICD-10-CM

## 2024-09-29 DIAGNOSIS — M545 Low back pain, unspecified: Secondary | ICD-10-CM | POA: Insufficient documentation

## 2024-09-29 DIAGNOSIS — M5441 Lumbago with sciatica, right side: Secondary | ICD-10-CM

## 2024-09-29 MED ORDER — CYCLOBENZAPRINE HCL 10 MG PO TABS: 10.0000 mg | ORAL_TABLET | Freq: Three times a day (TID) | ORAL | 0 refills | Status: DC | PRN

## 2024-09-29 MED ORDER — PREDNISONE 10 MG PO TABS: ORAL_TABLET | ORAL | 0 refills | Status: DC

## 2024-09-29 NOTE — Patient Instructions (Signed)
 Acute Back Pain, Adult Acute back pain is sudden and usually short-lived. It is often caused by an injury to the muscles and tissues in the back. The injury may result from: A muscle, tendon, or ligament getting overstretched or torn. Ligaments are tissues that connect bones to each other. Lifting something improperly can cause a back strain. Wear and tear (degeneration) of the spinal disks. Spinal disks are circular tissue that provide cushioning between the bones of the spine (vertebrae). Twisting motions, such as while playing sports or doing yard work. A hit to the back. Arthritis. You may have a physical exam, lab tests, and imaging tests to find the cause of your pain. Acute back pain usually goes away with rest and home care. Follow these instructions at home: Managing pain, stiffness, and swelling Take over-the-counter and prescription medicines only as told by your health care provider. Treatment may include medicines for pain and inflammation that are taken by mouth or applied to the skin, or muscle relaxants. Your health care provider may recommend applying ice during the first 24-48 hours after your pain starts. To do this: Put ice in a plastic bag. Place a towel between your skin and the bag. Leave the ice on for 20 minutes, 2-3 times a day. Remove the ice if your skin turns bright red. This is very important. If you cannot feel pain, heat, or cold, you have a greater risk of damage to the area. If directed, apply heat to the affected area as often as told by your health care provider. Use the heat source that your health care provider recommends, such as a moist heat pack or a heating pad. Place a towel between your skin and the heat source. Leave the heat on for 20-30 minutes. Remove the heat if your skin turns bright red. This is especially important if you are unable to feel pain, heat, or cold. You have a greater risk of getting burned. Activity  Do not stay in bed. Staying in  bed for more than 1-2 days can delay your recovery. Sit up and stand up straight. Avoid leaning forward when you sit or hunching over when you stand. If you work at a desk, sit close to it so you do not need to lean over. Keep your chin tucked in. Keep your neck drawn back, and keep your elbows bent at a 90-degree angle (right angle). Sit high and close to the steering wheel when you drive. Add lower back (lumbar) support to your car seat, if needed. Take short walks on even surfaces as soon as you are able. Try to increase the length of time you walk each day. Do not sit, drive, or stand in one place for more than 30 minutes at a time. Sitting or standing for long periods of time can put stress on your back. Do not drive or use heavy machinery while taking prescription pain medicine. Use proper lifting techniques. When you bend and lift, use positions that put less stress on your back: [place] your knees. Keep the load close to your body. Avoid twisting. Exercise regularly as told by your health care provider. Exercising helps your back heal faster and helps prevent back injuries by keeping muscles strong and flexible. Work with a physical therapist to make a safe exercise program, as recommended by your health care provider. Do any exercises as told by your physical therapist. Lifestyle Maintain a healthy weight. Extra weight puts stress on your back and makes it difficult to have good  posture. Avoid activities or situations that make you feel anxious or stressed. Stress and anxiety increase muscle tension and can make back pain worse. Learn ways to manage anxiety and stress, such as through exercise. General instructions Sleep on a firm mattress in a comfortable position. Try lying on your side with your knees slightly bent. If you lie on your back, put a pillow under your knees. Keep your head and neck in a straight line with your spine (neutral position) when using electronic equipment like  smartphones or pads. To do this: Raise your smartphone or pad to look at it instead of bending your head or neck to look down. Put the smartphone or pad at the level of your face while looking at the screen. Follow your treatment plan as told by your health care provider. This may include: Cognitive or behavioral therapy. Acupuncture or massage therapy. Meditation or yoga. Contact a health care provider if: You have pain that is not relieved with rest or medicine. You have increasing pain going down into your legs or buttocks. Your pain does not improve after 2 weeks. You have pain at night. You lose weight without trying. You have a fever or chills. You develop nausea or vomiting. You develop abdominal pain. Get help right away if: You develop new bowel or bladder control problems. You have unusual weakness or numbness in your arms or legs. You feel faint. These symptoms may represent a serious problem that is an emergency. Do not wait to see if the symptoms will go away. Get medical help right away. Call your local emergency services (911 in the [place] Do not drive yourself to the hospital. Summary Acute back pain is sudden and usually short-lived. Use proper lifting techniques. When you bend and lift, use positions that put less stress on your back. Take over-the-counter and prescription medicines only as told by your health care provider, and apply heat or ice as told. This information is not intended to replace advice given to you by your health care provider. Make sure you discuss any questions you have with your health care provider. Document Revised: 01/26/2021 Document Reviewed: 01/26/2021 Elsevier Patient Education  2024 [employer].

## 2024-09-29 NOTE — Telephone Encounter (Signed)
 Additional Information . Commented on: [1] MODERATE back pain (e.g., interferes with normal activities) AND [2] present > 3 days    Pt requested video visit with any provider today due to working around job schedule  Protocols used: Back Pain-A-AH

## 2024-09-29 NOTE — Telephone Encounter (Addendum)
 FYI Only or Action Required?: FYI only for provider: appointment scheduled on 09/29/2024.  Patient was last seen in primary care on 09/09/2024 by Lemon Raisin, MD.  Called Nurse Triage reporting Back Pain.  Symptoms began yesterday.  Interventions attempted: Rest, hydration, or home remedies.  Symptoms are: gradually worsening.  Triage Disposition: See PCP When Office is Open (Within 3 Days)  Patient/caregiver understands and will follow disposition?: Yes    Copied from CRM [id number]. Topic: Appointments - Appointment Scheduling >> Sep 29, 2024 12:18 PM Tonda B wrote: Patient/patient representative is calling to schedule an appointment. Refer to attachments for appointment information.  Patient is having severe back pain Reason for Disposition  [1] MODERATE back pain (e.g., interferes with normal activities) AND [2] present > 3 days  Answer Assessment - Initial Assessment Questions 1. ONSET: When did the pain begin? (e.g., minutes, hours, days)     yesterday 2. LOCATION: Where does it hurt? (upper, mid or lower back)     Middle lower 3. SEVERITY: How bad is the pain?  (e.g., Scale 1-10; mild, moderate, or severe)     Moderate to severe 4. PATTERN: Is the pain constant? (e.g., yes, no; constant, intermittent)      constant 5. RADIATION: Does the pain shoot into your legs or somewhere else?    Bilateral shoulder  6. CAUSE:  What do you think is causing the back pain?      unsure 7. BACK OVERUSE:  Any recent lifting of heavy objects, strenuous work or exercise?     unsure 8. MEDICINES: What have you taken so far for the pain? (e.g., nothing, acetaminophen , NSAIDS)     OTC medication without relief 9. NEUROLOGIC SYMPTOMS: Do you have any weakness, numbness, or problems with bowel/bladder control?     no 10. OTHER SYMPTOMS: Do you have any other symptoms? (e.g., fever, abdomen pain, burning with urination, blood in urine)       no 11. PREGNANCY: Is  there any chance you are pregnant? When was your last menstrual period?       no  Pressure in back with bending over  Protocols used: Back Pain-A-AH

## 2024-09-29 NOTE — Progress Notes (Signed)
 There were no vitals taken for this visit.   Subjective:    Patient ID: Melanie [name], female    DOB: [birth year], 33 y.o.   MRN: [id number]  HPI: Melanie [name] is a 33 y.o. female  Chief Complaint  Patient presents with   Back Pain    Started new job recently and it started hurting yesterday. Shooting pains down legs.    Virtual Visit via Video Note  I connected with Melanie [name] on 09/29/24 at  2:00 PM EST by a video enabled telemedicine application and verified that I am speaking with the correct person using two identifiers.  Location: Patient: home Provider: work   I discussed the limitations of evaluation and management by telemedicine and the availability of in person appointments. The patient expressed understanding and agreed to proceed.  I discussed the assessment and treatment plan with the patient. The patient was provided an opportunity to ask questions and all were answered. The patient agreed with the plan and demonstrated an understanding of the instructions.   The patient was advised to call back or seek an in-person evaluation if the symptoms worsen or if the condition fails to improve as anticipated.  I provided 25 minutes of non-face-to-face time during this encounter.   Lanetta Figuero T Nyra Anspaugh, NP   BACK PAIN Started with back pain yesterday. 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 No recent injuries. Pain shoots down both legs and sometimes one leg. Not currently pregnant. No rashes noted. Duration: days Mechanism of injury: no trauma Location: midline, bilateral, and low back Onset: sudden Severity: 9/10 Quality: sharp, dull, aching, and shooting Frequency: constant Radiation: R leg below the knee and L leg below the knee Aggravating factors: lifting, movement, and bending Alleviating factors: heat Status: fluctuating Treatments attempted: rest, heat, APAP, and ibuprofen , patches, Tiger Balm Relief with NSAIDs?: no Nighttime pain:  no Paresthesias /  decreased sensation:  no Bowel / bladder incontinence:  no Fevers:  no Dysuria / urinary frequency:  no   Relevant past medical, surgical, family and social history reviewed and updated as indicated. Interim medical history since our last visit reviewed. Allergies and medications reviewed and updated.  Review of Systems  Constitutional:  Negative for activity change, appetite change, diaphoresis, fatigue and fever.  Respiratory:  Negative for cough, chest tightness, shortness of breath and wheezing.   Cardiovascular:  Negative for chest pain, palpitations and leg swelling.  Gastrointestinal: Negative.   Musculoskeletal:  Positive for back pain.  Neurological: Negative.   Psychiatric/Behavioral: Negative.     Per HPI unless specifically indicated above     Objective:    There were no vitals taken for this visit.  Wt Readings from Last 3 Encounters:  09/09/24 108 lb (49 kg)  05/18/24 98 lb 6.4 oz (44.6 kg)  04/25/24 100 lb (45.4 kg)    Physical Exam Vitals and nursing note reviewed.  Constitutional:      General: She is awake. She is not in acute distress.    Appearance: She is well-developed. She is not ill-appearing.  HENT:     Head: Normocephalic.     Right Ear: Hearing normal.     Left Ear: Hearing normal.  Eyes:     General: Lids are normal.        Right eye: No discharge.        Left eye: No discharge.     Conjunctiva/sclera: Conjunctivae normal.  Pulmonary:     Effort: Pulmonary effort is normal.  No accessory muscle usage or respiratory distress.  Musculoskeletal:     Cervical back: Normal range of motion.  Neurological:     Mental Status: She is alert and oriented to person, place, and time.  Psychiatric:        Attention and Perception: Attention normal.        Mood and Affect: Mood normal.        Behavior: Behavior normal. Behavior is cooperative.        Thought Content: Thought content normal.        Judgment: Judgment normal.    Results for orders  placed or performed in visit on 09/09/24  POC Covid19/Flu A&B Antigen   Collection Time: 09/09/24 11:44 AM  Result Value Ref Range   Influenza A Antigen, POC Negative Negative   Influenza B Antigen, POC Negative Negative   Covid Antigen, POC Positive (A) Negative      Assessment & Plan:   Problem List Items Addressed This Visit       Other   Acute low back pain - Primary   Acute for 24 hours, no red flag symptoms mentioned and no recent injuries.  Will send in steroid taper for pain, which should help sciatic discomfort, and Flexeril  as needed for discomfort. Advised her not to take Flexeril  if driving or working. Only take if at home resting. Continue current at home regimen. If pain continues or worsens then would recommend in office visit for assessment.      Relevant Medications   predniSONE (DELTASONE) 10 MG tablet   cyclobenzaprine  (FLEXERIL ) 10 MG tablet     Follow up plan: Return if symptoms worsen or fail to improve.

## 2024-09-29 NOTE — Assessment & Plan Note (Signed)
 Acute for 24 hours, no red flag symptoms mentioned and no recent injuries.  Will send in steroid taper for pain, which should help sciatic discomfort, and Flexeril  as needed for discomfort. Advised her not to take Flexeril  if driving or working. Only take if at home resting. Continue current at home regimen. If pain continues or worsens then would recommend in office visit for assessment.

## 2024-09-30 ENCOUNTER — Ambulatory Visit: Admitting: Pediatrics

## 2024-10-04 ENCOUNTER — Telehealth

## 2024-10-04 DIAGNOSIS — R3989 Other symptoms and signs involving the genitourinary system: Secondary | ICD-10-CM

## 2024-10-04 MED ORDER — CEPHALEXIN 500 MG PO CAPS: 500.0000 mg | ORAL_CAPSULE | Freq: Two times a day (BID) | ORAL | 0 refills | Status: AC

## 2024-10-04 MED ORDER — PHENAZOPYRIDINE HCL 200 MG PO TABS: 200.0000 mg | ORAL_TABLET | Freq: Three times a day (TID) | ORAL | 0 refills | Status: DC | PRN

## 2024-10-04 NOTE — Progress Notes (Signed)
 Virtual Visit Consent   Melanie [name], you are scheduled for a virtual visit with a [facility] provider today. Just as with appointments in the office, your consent must be obtained to participate. Your consent will be active for this visit and any virtual visit you may have with one of our providers in the next 365 days. If you have a MyChart account, a copy of this consent can be sent to you electronically.  As this is a virtual visit, video technology does not allow for your provider to perform a traditional examination. This may limit your provider's ability to fully assess your condition. If your provider identifies any concerns that need to be evaluated in person or the need to arrange testing (such as labs, EKG, etc.), we will make arrangements to do so. Although advances in technology are sophisticated, we cannot ensure that it will always work on either your end or our end. If the connection with a video visit is poor, the visit may have to be switched to a telephone visit. With either a video or telephone visit, we are not always able to ensure that we have a secure connection.  By engaging in this virtual visit, you consent to the provision of healthcare and authorize for your insurance to be billed (if applicable) for the services provided during this visit. Depending on your insurance coverage, you may receive a charge related to this service.  I need to obtain your verbal consent now. Are you willing to proceed with your visit today? Melanie [name] has provided verbal consent on 10/04/2024 for a virtual visit (video or telephone). Jon CHRISTELLA Belt, NP  Date: 10/04/2024 10:53 AM   Virtual Visit via Video Note   I, Jon CHRISTELLA Belt, connected with  Melanie [name]  ([id number], [birth year]) on 10/04/24 at 10:45 AM EST by a video-enabled telemedicine application and verified that I am speaking with the correct person using two identifiers.  Location: Patient: Virtual Visit Location Patient: Other: in  parked car in [place] Provider: Virtual Visit Location Provider: Home Office   I discussed the limitations of evaluation and management by telemedicine and the availability of in person appointments. The patient expressed understanding and agreed to proceed.    History of Present Illness: Melanie [name] is a 33 y.o. who identifies as a female who was assigned female at birth, and is being seen today for UTI x2 days ago. Burns with urination. Has not had a uti in a long time but it feels the same. No abd pain, flank pain, vaginal discharge, n/v, fever or chills, or blood in urine.   LMP last month, no chance could be pregnant.   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 2-3x daily, Disp: , Rfl:    cyclobenzaprine  (FLEXERIL ) 10 MG tablet, Take 1 tablet (10 mg total) by mouth 3 (three) times daily as needed for muscle spasms., Disp: 30 tablet, Rfl: 0   ferrous sulfate 325 (65 FE) MG EC tablet, Take 325 mg by mouth., Disp: , Rfl:    meloxicam  (MOBIC ) 15 MG tablet, Take 1 tablet (15 mg total) by mouth daily. (Patient not taking: Reported on 09/29/2024), Disp: 30 tablet, Rfl: 0   norethindrone  (MICRONOR ) 0.35 MG tablet, Take 1 tablet (0.35 mg total)  by mouth daily. (Patient not taking: Reported on 09/29/2024), Disp: 84 tablet, Rfl: 3   PARoxetine (PAXIL) 30 MG tablet, Take 30 mg by mouth daily. (Patient not taking: Reported on 09/29/2024), Disp: , Rfl:    predniSONE (DELTASONE) 10 MG tablet, Take 6 tablets by mouth daily for 2 days, then reduce by 1 tablet every 2 days until gone, Disp: 42 tablet, Rfl: 0  Observations/Objective: Patient is well-developed, well-nourished in no acute distress.  Resting comfortably  in parked car in [place]  Head is normocephalic, atraumatic.  No labored breathing.  Speech is clear and coherent with logical content.  Patient is alert and oriented at baseline.    Assessment and Plan: 1. Suspected UTI (Primary) - cephALEXin  (KEFLEX ) 500 MG capsule; Take 1 capsule (500 mg total) by mouth 2 (two) times daily for 7 days.  Dispense: 14 capsule; Refill: 0 - phenazopyridine (PYRIDIUM) 200 MG tablet; Take 1 tablet (200 mg total) by mouth 3 (three) times daily as needed for pain.  Dispense: 10 tablet; Refill: 0    Follow Up Instructions: I discussed the assessment and treatment plan with the patient. The patient was provided an opportunity to ask questions and all were answered. The patient agreed with the plan and demonstrated an understanding of the instructions.  A copy of instructions were sent to the patient via MyChart unless otherwise noted below.    The patient was advised to call back or seek an in-person evaluation if the symptoms worsen or if the condition fails to improve as anticipated.    Jon CHRISTELLA Belt, NP

## 2024-10-04 NOTE — Patient Instructions (Signed)
 Melanie [name], thank you for joining Jon CHRISTELLA Belt, NP for today's virtual visit.  While this provider is not your primary care provider (PCP), if your PCP is located in our provider database this encounter information will be shared with them immediately following your visit.   A [facility] MyChart account gives you access to today's visit and all your visits, tests, and labs performed at [facility]  click here if you don't have a [facility] MyChart account or go to [email or web address]  Consent: (Patient) Melanie [name] provided verbal consent for this virtual visit at the beginning of the encounter.  Current Medications:  Current Outpatient Medications:    cephALEXin  (KEFLEX ) 500 MG capsule, Take 1 capsule (500 mg total) by mouth 2 (two) times daily for 7 days., Disp: 14 capsule, Rfl: 0   phenazopyridine (PYRIDIUM) 200 MG tablet, Take 1 tablet (200 mg total) by mouth 3 (three) times daily as needed for pain., Disp: 10 tablet, Rfl: 0   ALPRAZolam (XANAX) 1 MG tablet, Take 1 mg by mouth. 2-3x daily, Disp: , Rfl:    cyclobenzaprine  (FLEXERIL ) 10 MG tablet, Take 1 tablet (10 mg total) by mouth 3 (three) times daily as needed for muscle spasms., Disp: 30 tablet, Rfl: 0   ferrous sulfate 325 (65 FE) MG EC tablet, Take 325 mg by mouth., Disp: , Rfl:    meloxicam  (MOBIC ) 15 MG tablet, Take 1 tablet (15 mg total) by mouth daily. (Patient not taking: Reported on 09/29/2024), Disp: 30 tablet, Rfl: 0   norethindrone  (MICRONOR ) 0.35 MG tablet, Take 1 tablet (0.35 mg total) by mouth daily. (Patient not taking: Reported on 09/29/2024), Disp: 84 tablet, Rfl: 3   PARoxetine (PAXIL) 30 MG tablet, Take 30 mg by mouth daily. (Patient not taking: Reported on 09/29/2024), Disp: , Rfl:    predniSONE (DELTASONE) 10 MG tablet, Take 6 tablets by mouth daily for 2 days, then reduce by 1 tablet every 2 days until gone, Disp: 42 tablet, Rfl: 0   Medications ordered in this encounter:  Meds ordered this  encounter  Medications   cephALEXin  (KEFLEX ) 500 MG capsule    Sig: Take 1 capsule (500 mg total) by mouth 2 (two) times daily for 7 days.    Dispense:  14 capsule    Refill:  0   phenazopyridine (PYRIDIUM) 200 MG tablet    Sig: Take 1 tablet (200 mg total) by mouth 3 (three) times daily as needed for pain.    Dispense:  10 tablet    Refill:  0     *If you need refills on other medications prior to your next appointment, please contact your pharmacy*  Follow-Up: Call back or seek an in-person evaluation if the symptoms worsen or if the condition fails to improve as anticipated.  [facility] [phone]  Other Instructions  This treatment plan should take care of a UTI. If you are not getting better, you will need to be checked in person as you might need urine testing.   If you have been instructed to have an in-person evaluation today at a local Urgent Care facility, please use the link below. It will take you to a list of all of our available [facility], including address, phone number and hours of operation. Please do not delay care.  [facility]  If you or a family member do not have a primary care provider, use the link below to schedule a visit and establish care.  When you choose a [facility] primary care physician or advanced practice provider, you gain a long-term partner in health. Find a Primary Care Provider  Learn more about [facility]'s in-office and virtual care options: [facility] - Get Care Now

## 2024-10-09 ENCOUNTER — Encounter: Payer: Self-pay | Admitting: Emergency Medicine

## 2024-10-12 ENCOUNTER — Ambulatory Visit: Payer: Self-pay

## 2024-10-12 ENCOUNTER — Ambulatory Visit: Admitting: Pediatrics

## 2024-10-12 ENCOUNTER — Encounter: Payer: Self-pay | Admitting: Pediatrics

## 2024-10-12 ENCOUNTER — Telehealth: Payer: Self-pay | Admitting: Pediatrics

## 2024-10-12 VITALS — BP 111/74 | HR 81 | Temp 97.7°F | Ht 64.02 in

## 2024-10-12 DIAGNOSIS — R300 Dysuria: Secondary | ICD-10-CM

## 2024-10-12 DIAGNOSIS — R399 Unspecified symptoms and signs involving the genitourinary system: Secondary | ICD-10-CM

## 2024-10-12 LAB — MICROSCOPIC EXAMINATION

## 2024-10-12 LAB — URINALYSIS, ROUTINE W REFLEX MICROSCOPIC
Bilirubin, UA: NEGATIVE
Glucose, UA: NEGATIVE
Ketones, UA: NEGATIVE
Leukocytes,UA: NEGATIVE
Nitrite, UA: NEGATIVE
Protein,UA: NEGATIVE
Specific Gravity, UA: 1.01 (ref 1.005–1.030)
Urobilinogen, Ur: 0.2 mg/dL (ref 0.2–1.0)
pH, UA: 5.5 (ref 5.0–7.5)

## 2024-10-12 MED ORDER — MIRABEGRON ER 25 MG PO TB24: 25.0000 mg | ORAL_TABLET | Freq: Every day | ORAL | 0 refills | Status: DC

## 2024-10-12 NOTE — Telephone Encounter (Signed)
 FYI Only or Action Required?: Action required by provider: request for appointment.  Patient was last seen in primary care on 10/04/2024 by Richad Jon HERO, NP.  Called Nurse Triage reporting Dysuria.  Symptoms began today.  Interventions attempted: Nothing.  Symptoms are: unchanged.  Triage Disposition: No disposition on file.  Patient/caregiver understands and will follow disposition?:    Copied from CRM [id number]. Topic: Clinical - Red Word Triage >> Oct 12, 2024 12:01 PM Victoria B wrote: [facility] that prompted transfer to Nurse Triage: patient still has  pelvic pain and burning when urinating Answer Assessment - Initial Assessment Questions 1. REASON FOR CALL: What is the main reason for your call? or How can I best help you? Patient called to request sooner appt, due to work. Offered sooner appt at alternative clinic, none available with pcp. Patient declined and reports will keep scheduled appt.  Patient reports no new or worsen symptoms.  Protocols used: Information Only Call - No Triage-A-AH

## 2024-10-12 NOTE — Telephone Encounter (Signed)
 Called to see if she wanted to be seen sooner for the UTI symptoms. Please schedule what is available for her, does not need to be transferred to [facility]. If this is an acute need, she can be scheduled at another office as well.

## 2024-10-12 NOTE — Progress Notes (Signed)
 Office Visit  BP 111/74 (BP Location: Left Arm, Patient Position: Sitting, Cuff Size: Normal)   Pulse 81   Temp 97.7 F (36.5 C) (Oral)   Ht 5' 4.02 (1.626 m)   LMP 10/06/2024 (Exact Date)   SpO2 97%   BMI 18.53 kg/m    Subjective:    Patient ID: Melanie [name] Feeling, female    DOB: [birth year], 33 y.o.   MRN: [id number]  HPI: Melanie [name] is a 33 y.o. female  Chief Complaint  Patient presents with   Urinary Tract Infection    Patient stated there is a burning sensation when she pee's. No discharge, there is a discomfort when she is just sitting.    Discussed the use of AI scribe software for clinical note transcription with the patient, who gave verbal consent to proceed.  History of Present Illness   Melanie [name] is a 33 year old female who presents with severe pelvic pain, especially during urination.  She experiences severe pelvic pain, describing it as feeling like 'somebody has a lighter inside of there.' The pain is particularly excruciating during urination. No discharge is present.  She has tried various over-the-counter medications including Tylenol , arthritis medication, and ibuprofen, but these have not alleviated the pain. She was prescribed an antibiotic on November 17th, which she completed, but she does not recall the name of the medication. She currently has no medications to take for the pain.  She recently started using a new perfumed soap from [employer], which she suspects might be contributing to her symptoms as she uses it during showers.  She is not currently sexually active, noting that a previous experience has 'scarred' her.      Relevant past medical, surgical, family and social history reviewed and updated as indicated. Interim medical history since our last visit reviewed. Allergies and medications reviewed and updated.  ROS per HPI unless specifically indicated above     Objective:    BP 111/74 (BP Location: Left Arm, Patient Position: Sitting, Cuff Size:  Normal)   Pulse 81   Temp 97.7 F (36.5 C) (Oral)   Ht 5' 4.02 (1.626 m)   LMP 10/06/2024 (Exact Date)   SpO2 97%   BMI 18.53 kg/m   Wt Readings from Last 3 Encounters:  09/09/24 108 lb (49 kg)  05/18/24 98 lb 6.4 oz (44.6 kg)  04/25/24 100 lb (45.4 kg)     Physical Exam Constitutional:      Appearance: Normal appearance.  Pulmonary:     Effort: Pulmonary effort is normal.  Abdominal:     Tenderness: There is no right CVA tenderness or left CVA tenderness.  Musculoskeletal:        General: Normal range of motion.  Skin:    Comments: Normal skin color  Neurological:     General: No focal deficit present.     Mental Status: She is alert. Mental status is at baseline.  Psychiatric:        Mood and Affect: Mood normal.        Behavior: Behavior normal.        Thought Content: Thought content normal.         10/12/2024    2:34 PM 09/29/2024    2:20 PM 07/28/2024    4:13 PM 05/18/2024    9:45 AM 04/29/2024    8:23 AM  Depression screen PHQ 2/9  Decreased Interest 1 0 1 2 3   Down, Depressed, Hopeless 1 0 1 2 3   PHQ - 2  Score 2 0 2 4 6   Altered sleeping 1  1 2 3   Tired, decreased energy 1  1 2 3   Change in appetite 1  1 2 3   Feeling bad or failure about yourself  1  1 2 3   Trouble concentrating 1  1 2 3   Moving slowly or fidgety/restless 1  1 2 3   Suicidal thoughts 1  1 2 3   PHQ-9 Score 9  9  18  27    Difficult doing work/chores Somewhat difficult  Somewhat difficult Very difficult Very difficult     Data saved with a previous flowsheet row definition       10/12/2024    2:34 PM 09/29/2024    2:20 PM 07/28/2024    4:13 PM 05/18/2024    9:45 AM  GAD 7 : Generalized Anxiety Score  Nervous, Anxious, on Edge 1 0 1 3  Control/stop worrying 1 0 1 3  Worry too much - different things 1 0 1 3  Trouble relaxing 1 0 1 3  Restless 1 0 1 3  Easily annoyed or irritable 1 0 1 3  Afraid - awful might happen 1 0 1 3  Total GAD 7 Score 7 0 7 21  Anxiety Difficulty Somewhat  difficult  Somewhat difficult Very difficult       Assessment & Plan:  Assessment & Plan   Dysuria Experiencing severe genitourinary pain during urination. Completed Keflex . Differential includes UTI and possible post-antibiotic yeast infection, though yeast infection is atypical without discharge. Rapid urine test negative, culture pending. No fever or systemic symptoms. Declined swab since she's on her period so will r/o UTI in case of recurrence. Sent mirabegron  for bladder spasms. - Await urine culture results. - Consider UTI treatment if culture confirms infection.  -     Urinalysis, Routine w reflex microscopic -     Urine Culture -     WET PREP FOR TRICH, YEAST, CLUE -     Mirabegron  ER; Take 1 tablet (25 mg total) by mouth daily for 14 days.  Dispense: 14 tablet; Refill: 0 -     Microscopic Examination    Follow up plan: No follow-ups on file.  Hadassah SHAUNNA Nett, MD

## 2024-10-13 ENCOUNTER — Ambulatory Visit: Payer: Self-pay | Admitting: Pediatrics

## 2024-10-14 LAB — URINE CULTURE: Organism ID, Bacteria: NO GROWTH

## 2024-10-15 ENCOUNTER — Other Ambulatory Visit (HOSPITAL_COMMUNITY): Payer: Self-pay

## 2024-10-15 ENCOUNTER — Telehealth: Payer: Self-pay

## 2024-10-15 NOTE — Telephone Encounter (Signed)
 Please Note we have received a P/A request from the pts pref'd pharmacy for Rx mirabegron  ER (MYRBETRIQ ) 25 MG TB24 tablet.   The pts pref'd pharmacy has been contacted and was informed to process the Rx through the pts [employer] instead of the pts Medicaid. The Rx has now been successfully filled with a copay of 0.00.   T/C 0.00

## 2024-10-19 ENCOUNTER — Ambulatory Visit: Payer: Self-pay

## 2024-10-19 ENCOUNTER — Encounter: Payer: Self-pay | Admitting: Pediatrics

## 2024-10-19 NOTE — Telephone Encounter (Signed)
 FYI Only or Action Required?: FYI only for provider: appointment scheduled on 12/5.  Patient was last seen in primary care on 10/12/2024 by Melanie Hadassah SQUIBB, MD.  Called Nurse Triage reporting Dizziness.  Symptoms are: unchanged.  Triage Disposition: See PCP When Office is Open (Within 3 Days)  Patient/caregiver understands and will follow disposition?: Yes, will follow disposition  Copied from CRM #[id number]. Topic: Clinical - Red Word Triage >> Oct 19, 2024 12:13 PM Tobias L wrote: Red Word that prompted transfer to Nurse Triage: some dizziness, lack of appetite    ----------------------------------------------------------------------- From previous Reason for Contact - Lab/Test Order Request: Reason for CRM: Patient requesting labs be completed. Patient states she has been tired out of energy & Reason for Disposition  [1] MILD weakness (e.g., does not interfere with ability to work, go to school, normal activities) AND [2] persists > 1 week  Answer Assessment - Initial Assessment Questions 1. DESCRIPTION: Describe your dizziness.   Nothing major like that, just if I get up to fast  Pt requesting appt to get blood work and make sure it's all good. Pt states she has had loss of energy. Pt reiterates that she is looking for appt.  Pt scheduled soonest appt at home clinic, offered regional clinics, pt declined.  Protocols used: Dizziness - Lightheadedness-A-AH, Weakness (Generalized) and Fatigue-A-AH

## 2024-10-20 ENCOUNTER — Other Ambulatory Visit: Payer: Self-pay | Admitting: Pediatrics

## 2024-10-20 DIAGNOSIS — R300 Dysuria: Secondary | ICD-10-CM

## 2024-10-20 DIAGNOSIS — R5383 Other fatigue: Secondary | ICD-10-CM

## 2024-10-20 NOTE — Progress Notes (Signed)
 Dysuria-placed lab orders.  Melanie SHAUNNA Nett, MD

## 2024-10-22 ENCOUNTER — Ambulatory Visit: Admitting: Nurse Practitioner

## 2024-10-22 NOTE — Progress Notes (Deleted)
   LMP 10/06/2024 (Exact Date)    Subjective:    Patient ID: Anyela Napierkowski, female    DOB: 1990/12/19, 33 y.o.   MRN: [id number]  HPI: Glora Hulgan is a 33 y.o. female  No chief complaint on file.  FATIGUE Duration:  {Blank single:19197::chronic,days,weeks,months} Severity: {Blank single:19197::mild,moderate,severe,1/10,2/10,3/10,4/10,5/10,6/10,7/10,8/10,9/10,10/10}  Onset: {Blank single:19197::sudden,gradual} Context when symptoms started:  {Blank single:19197::none,unknown} Symptoms improve with rest: {Blank single:19197::yes,no}  Depressive symptoms: {Blank single:19197::yes,no} Stress/anxiety: {Blank single:19197::yes,no} Insomnia: {Blank single:19197::yes,no} {Blank single:19197::hard to fall asleep,hard to stay asleep} Snoring: {Blank single:19197::yes,no} Observed apnea by bed partner: {Blank single:19197::yes,no} Daytime hypersomnolence:{Blank single:19197::yes,no} Wakes feeling refreshed: {Blank single:19197::yes,no} History of sleep study: {Blank single:19197::yes,no} Dysnea on exertion:  {Blank single:19197::yes,no} Orthopnea/PND: {Blank single:19197::yes,no} Chest pain: {Blank single:19197::yes,no} Chronic cough: {Blank single:19197::yes,no} Lower extremity edema: {Blank single:19197::yes,no} Arthralgias:{Blank single:19197::yes,no} Myalgias: {Blank single:19197::yes,no} Weakness: {Blank single:19197::yes,no} Rash: {Blank single:19197::yes,no}  Relevant past medical, surgical, family and social history reviewed and updated as indicated. Interim medical history since our last visit reviewed. Allergies and medications reviewed and updated.  Review of Systems  Per HPI unless specifically indicated above     Objective:    LMP 10/06/2024 (Exact Date)   Wt Readings from Last 3 Encounters:  09/09/24 108 lb (49 kg)  05/18/24 98 lb 6.4 oz (44.6 kg)   04/25/24 100 lb (45.4 kg)    Physical Exam  Results for orders placed or performed in visit on 10/12/24  Urine Culture   Collection Time: 10/12/24  2:38 PM   Specimen: Urine   UR  Result Value Ref Range   Urine Culture, Routine Final report    Organism ID, Bacteria No growth   Microscopic Examination   Collection Time: 10/12/24  2:38 PM   Urine  Result Value Ref Range   WBC, UA 0-5 0 - 5 /hpf   RBC, Urine 0-2 0 - 2 /hpf   Epithelial Cells (non renal) 0-10 0 - 10 /hpf   Bacteria, UA Few (A) None seen/Few  Urinalysis, Routine w reflex microscopic   Collection Time: 10/12/24  2:38 PM  Result Value Ref Range   Specific Gravity, UA 1.010 1.005 - 1.030   pH, UA 5.5 5.0 - 7.5   Color, UA Yellow Yellow   Appearance Ur Slightly cloudy Clear   Leukocytes,UA Negative Negative   Protein,UA Negative Negative/Trace   Glucose, UA Negative Negative   Ketones, UA Negative Negative   RBC, UA 2+ (A) Negative   Bilirubin, UA Negative Negative   Urobilinogen, Ur 0.2 0.2 - 1.0 mg/dL   Nitrite, UA Negative Negative   Microscopic Examination See below:       Assessment & Plan:   Problem List Items Addressed This Visit   None    Follow up plan: No follow-ups on file.

## 2024-10-29 ENCOUNTER — Other Ambulatory Visit: Payer: Self-pay | Admitting: Pediatrics

## 2024-10-29 DIAGNOSIS — D509 Iron deficiency anemia, unspecified: Secondary | ICD-10-CM

## 2024-10-29 NOTE — Progress Notes (Signed)
 Placed future lab orders.  Hadassah SHAUNNA Nett, MD

## 2024-11-01 ENCOUNTER — Other Ambulatory Visit: Payer: Self-pay | Admitting: Pediatrics

## 2024-11-01 DIAGNOSIS — R300 Dysuria: Secondary | ICD-10-CM

## 2024-11-02 ENCOUNTER — Encounter: Payer: Self-pay | Admitting: Pediatrics

## 2024-11-02 ENCOUNTER — Telehealth: Admitting: Pediatrics

## 2024-11-02 DIAGNOSIS — R399 Unspecified symptoms and signs involving the genitourinary system: Secondary | ICD-10-CM

## 2024-11-02 DIAGNOSIS — D509 Iron deficiency anemia, unspecified: Secondary | ICD-10-CM

## 2024-11-02 NOTE — Progress Notes (Signed)
° °  Telehealth Visit  I connected with  Melanie [name] on 11/02/2024 by a video enabled telemedicine application and verified that I am speaking with the correct person using two identifiers.   I discussed the limitations of evaluation and management by telemedicine. The patient expressed understanding and agreed to proceed.  Subjective:    Patient ID: Melanie [name], female    DOB: 1991-01-01, 33 y.o.   MRN: [id number]  HPI: Melanie [name] is a 33 y.o. female  Chief Complaint  Patient presents with   Urinary Tract Infection    Patient states she has been having UTI symptoms for the last 3 weeks. States she went [place] the [place] in a different county and was given an antibiotic. States symptoms have cleared up and she has finished the antibiotic.    #recent UTI Seen in ER and was given abx, this has improved her symptoms Wanted to double check labs given recs to stop iron supp Denies fevers, chills, nausea, vomiting  Relevant past medical, surgical, family and social history reviewed and updated as indicated. Interim medical history since our last visit reviewed. Allergies and medications reviewed and updated.  ROS per HPI unless specifically indicated above     Objective:    LMP 10/06/2024 (Exact Date)   Wt Readings from Last 3 Encounters:  09/09/24 108 lb (49 kg)  05/18/24 98 lb 6.4 oz (44.6 kg)  04/25/24 100 lb (45.4 kg)     Physical Exam Constitutional:      General: She is not in acute distress.    Appearance: Normal appearance.  Neurological:     General: No focal deficit present.     Mental Status: She is alert. Mental status is at baseline.      LIMITED EXAM GIVEN VIDEO VISIT     Assessment & Plan:  Assessment & Plan   Iron deficiency anemia, unspecified iron deficiency anemia type Was told to stop supp but this was not checked on my chart review. Labs already in place and pt will stop by.  Urinary tract infection symptoms Resolved, CTM.    Follow up plan: Return if  symptoms worsen or fail to improve.  Melanie SHAUNNA Nett, MD   This visit was completed via video visit through MyChart due to the restrictions of the COVID-19 pandemic. All issues as above were discussed and addressed. Physical exam was done as above through visual confirmation on video through MyChart. If it was felt that the patient should be evaluated in the office, they were directed there. The patient verbally consented to this visit.  Location of the patient: parking lot Location of the provider: work Those involved with this call:  Provider: Hadassah Nett, MD CMA: Laymon Metro, CMA Time spent on call: 10 minutes with patient face to face via video conference. More than 50% of this time was spent in counseling and coordination of care. 10 minutes total spent in review of patient's record and preparation of their chart. Total time spent on this encounter: 20 minutes.

## 2024-11-03 NOTE — Telephone Encounter (Signed)
 No longer listed on current medication list Requested Prescriptions  Pending Prescriptions Disp Refills   mirabegron  ER (MYRBETRIQ ) 25 MG TB24 tablet [Pharmacy Med Name: MIRABEGRON  25MG  ER TABLETS] 14 tablet 0    Sig: TAKE 1 TABLET(25 MG) BY MOUTH DAILY FOR 14 DAYS     Urology: Bladder Agents - mirabegron  Passed - 11/03/2024 11:29 AM      Passed - Cr in normal range and within 360 days    Creatinine  Date Value Ref Range Status  09/07/2012 0.69 0.60 - 1.30 mg/dL Final   Creatinine, Ser  Date Value Ref Range Status  05/18/2024 0.73 0.57 - 1.00 mg/dL Final         Passed - ALT in normal range and within 360 days    ALT  Date Value Ref Range Status  04/30/2024 17 0 - 44 U/L Final   SGPT (ALT)  Date Value Ref Range Status  09/07/2012 12 12 - 78 U/L Final         Passed - AST in normal range and within 360 days    AST  Date Value Ref Range Status  04/30/2024 17 15 - 41 U/L Final   SGOT(AST)  Date Value Ref Range Status  09/07/2012 37 15 - 37 Unit/L Final         Passed - eGFR is 15 or above and within 360 days    EGFR (African American)  Date Value Ref Range Status  09/07/2012 >60  Final   GFR calc Af Amer  Date Value Ref Range Status  06/12/2020 >60 >60 mL/min Final   EGFR (Non-African Amer.)  Date Value Ref Range Status  09/07/2012 >60  Final    Comment:    eGFR values <41mL/min/1.73 m2 may be an indication of chronic kidney disease (CKD). Calculated eGFR is useful in patients with stable renal function. The eGFR calculation will not be reliable in acutely ill patients when serum creatinine is changing rapidly. It is not useful in  patients on dialysis. The eGFR calculation may not be applicable to patients at the low and high extremes of body sizes, pregnant women, and vegetarians. POTASSIUM/AST - Slight hemolysis, interpret results with  - caution.    GFR, Estimated  Date Value Ref Range Status  04/30/2024 >60 >60 mL/min Final    Comment:     (NOTE) Calculated using the CKD-EPI Creatinine Equation (2021)    eGFR  Date Value Ref Range Status  05/18/2024 111 >59 mL/min/1.73 Final         Passed - Last BP in normal range    BP Readings from Last 1 Encounters:  10/12/24 111/74         Passed - Valid encounter within last 12 months    Recent Outpatient Visits           Yesterday Iron deficiency anemia, unspecified iron deficiency anemia type   [place] [facility] Herold Hadassah SQUIBB, MD   3 weeks ago Dysuria   [facility] Herold Hadassah SQUIBB, MD   1 month ago Acute bilateral low back pain with bilateral sciatica   [place] [facility] Melanie DASEN, NP   1 month ago COVID-19 virus infection   [facility], Harlene, MD   3 months ago Acute pain of right shoulder   [place] [facility] Herold Hadassah SQUIBB, MD

## 2024-11-23 ENCOUNTER — Ambulatory Visit: Payer: Self-pay

## 2024-11-23 ENCOUNTER — Encounter

## 2024-11-23 NOTE — Telephone Encounter (Signed)
 FYI Only or Action Required?: FYI only for provider: appointment scheduled on 11/24/24.  Patient was last seen in primary care on 11/02/2024 by Melanie Hadassah SQUIBB, MD.  Called Nurse Triage reporting Shoulder Pain.  Symptoms began yesterday.  Interventions attempted: OTC medications: ibuprofen.  Symptoms are: unchanged.  Triage Disposition: See PCP When Office is Open (Within 3 Days)  Patient/caregiver understands and will follow disposition?: Yes    Copied from CRM [id number]. Topic: Clinical - Red Word Triage >> Nov 23, 2024  4:58 PM China J wrote: [facility] that prompted transfer to Nurse Triage: Patient is having excruciating shoulder pain that's moving to the middle of her upper back area.  (Patient dropped call) Reason for Disposition  [1] MODERATE pain (e.g., interferes with normal activities) AND [2] present > 3 days  Answer Assessment - Initial Assessment Questions Returned call to pt to f/u on symptoms. Pt states she has been having L shoulder to elbow pain that is also in her upper back since yesterday, not relieved with ibuprofen. Pt denies chest pain, SOB, no radiation of pain, no numbness or weakness, no heart racing or cardiac symptoms. Discussed no appts with PCP and pt agreeable to go to sister office to see alternative provider. Appointment scheduled for evaluation. Patient agrees with plan of care, and will call back if anything changes, or if symptoms worsen.     1. ONSET: When did the pain start?     Yesterday   2. LOCATION: Where is the pain located?     L shoulder to elbow and back   3. PAIN: How bad is the pain? (Scale 1-10; or mild, moderate, severe)     8   4. WORK OR EXERCISE: Has there been any recent work or exercise that involved this part of the body?     No   5. CAUSE: What do you think is causing the shoulder pain?     Unsure   6. OTHER SYMPTOMS: Do you have any other symptoms? (e.g., neck pain, swelling, rash, fever, numbness,  weakness)     None  Protocols used: Shoulder Pain-A-AH

## 2024-11-24 ENCOUNTER — Ambulatory Visit: Admitting: Family Medicine

## 2024-12-02 ENCOUNTER — Ambulatory Visit: Admitting: Family Medicine

## 2024-12-06 ENCOUNTER — Encounter: Payer: Self-pay | Admitting: Pediatrics

## 2024-12-18 ENCOUNTER — Encounter: Payer: Self-pay | Admitting: Emergency Medicine

## 2024-12-21 NOTE — Telephone Encounter (Signed)
 Patient would need appointment with new provider to discuss. Unclear where she is going to establish.
